# Patient Record
Sex: Female | Born: 1995 | Race: White | Hispanic: No | Marital: Single | Smoking: Never smoker
Health system: Southern US, Community
[De-identification: ages and names within clinical notes are randomized; demographics above are authoritative.]

## PROBLEM LIST (undated history)

## (undated) DIAGNOSIS — N390 Urinary tract infection, site not specified: Secondary | ICD-10-CM

## (undated) DIAGNOSIS — E669 Obesity, unspecified: Secondary | ICD-10-CM

## (undated) DIAGNOSIS — F32A Depression, unspecified: Secondary | ICD-10-CM

## (undated) DIAGNOSIS — H6690 Otitis media, unspecified, unspecified ear: Secondary | ICD-10-CM

## (undated) DIAGNOSIS — D649 Anemia, unspecified: Secondary | ICD-10-CM

## (undated) DIAGNOSIS — R519 Headache, unspecified: Secondary | ICD-10-CM

## (undated) DIAGNOSIS — H539 Unspecified visual disturbance: Secondary | ICD-10-CM

## (undated) DIAGNOSIS — F419 Anxiety disorder, unspecified: Secondary | ICD-10-CM

## (undated) DIAGNOSIS — K5900 Constipation, unspecified: Secondary | ICD-10-CM

## (undated) DIAGNOSIS — F909 Attention-deficit hyperactivity disorder, unspecified type: Secondary | ICD-10-CM

## (undated) HISTORY — DX: Otitis media, unspecified, unspecified ear: H66.90

## (undated) HISTORY — DX: Attention-deficit hyperactivity disorder, unspecified type: F90.9

## (undated) HISTORY — PX: WISDOM TOOTH EXTRACTION: SHX21

## (undated) HISTORY — DX: Unspecified visual disturbance: H53.9

## (undated) HISTORY — DX: Obesity, unspecified: E66.9

## (undated) HISTORY — DX: Urinary tract infection, site not specified: N39.0

## (undated) HISTORY — DX: Constipation, unspecified: K59.00

## (undated) HISTORY — PX: OTHER SURGICAL HISTORY: SHX169

---
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
## 2017-01-19 ENCOUNTER — Ambulatory Visit (INDEPENDENT_AMBULATORY_CARE_PROVIDER_SITE_OTHER): Payer: Medicaid Other | Admitting: Pediatrics

## 2017-01-19 ENCOUNTER — Encounter: Payer: Self-pay | Admitting: Pediatrics

## 2017-01-19 DIAGNOSIS — F902 Attention-deficit hyperactivity disorder, combined type: Secondary | ICD-10-CM | POA: Diagnosis not present

## 2017-01-19 DIAGNOSIS — H5210 Myopia, unspecified eye: Secondary | ICD-10-CM

## 2017-01-19 DIAGNOSIS — Z8739 Personal history of other diseases of the musculoskeletal system and connective tissue: Secondary | ICD-10-CM

## 2017-01-19 DIAGNOSIS — F4010 Social phobia, unspecified: Secondary | ICD-10-CM

## 2017-01-19 DIAGNOSIS — Z8742 Personal history of other diseases of the female genital tract: Secondary | ICD-10-CM

## 2017-01-19 MED ORDER — VYVANSE 70 MG PO CAPS: ORAL_CAPSULE | ORAL | 0 refills | Status: DC

## 2017-01-19 NOTE — Progress Notes (Signed)
[facility] [address], [place] [place] [address] Dept: [phone] Dept Fax: [phone] Loc: [phone] Loc Fax: [phone]  Medical Follow-up  Patient ID: Emily Pace, female  DOB: 07/21/1996, 21 y.o.  MRN: [id number]  Date of Evaluation: 01/19/17  PCP: Thurston Pounds, MD  Accompanied by: Mother Patient Lives with: mother and grandmother (maternal)  HISTORY/CURRENT STATUS:  HPI 3 month follow-up for medication management of ADHD, blood pressure check, and monitoring of weight. Patient is in her second semester at [employer]. This semester has been much more stressful than the first semester was because her classes are difficult and she started this semester with 4 classes. She dropped psychology but is still taking biology, critical thinking, and Spanish 1. She continues to take Vyvanse and think it helps her some although she is not certain how much it helps.  She does not appear to be having any significant side effects from the Vyvanse. She is still working at [employer] about 10 hours a week on weekends although the number of hours can be variable. She reports that she does not have any time to exercise although she plans on starting regular workouts at her grandmother's gym, [employer], in May 2018. She continues to have irregular menstrual periods with significant dysmenorrhea, but she has not been having a lot of jaw pain although her mouth pops and is sometimes hard to open. She has been experiencing anxiety intermittently but more lately because of the stress of the end of the school year and work.   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      Homework Time: 2-4 hours daily Performance/Grades: Doing well with  B's and 1C so far this semester although final exams and semester grades are coming up soon. Services: She requested accommodations through Disability Services at [facility] and is getting time and a half for tests and quizzes if needed. 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 She always works night shift from 5 PM till close. She reports that with school and work, she has little time to exercise although she and her mother recently went to the [place] and to [place] for spring break, and she did a lot of walking on the vacation.  Emily Pace does not have her driver's permit, but her mother reports that she is going to take [place] to [facility] and teacher to drive in the near future. Then Courteny plans on taking the driving test for her license once school is out next month. She has very little if any experience driving, however. They report that since she is over 34 years old, she doesn't need to have any experience in order to take her driver's test.  MEDICAL HISTORY:  Appetite: Good with mild appetite suppression on Vyvanse MVI/Other: None Fruits/Vegs: Att least 2-3 servings daily.  Calcium: Drinks milk a couple times a week, eats some cheese, but does not eat dairy products daily. She usually drinks low-fat milk. Iron: Likes meat and occasional eggs.  Sleep: Bedtime: 12-1 AM. Awakens: About 9 AM.  She usually gets enough hours of sleep per night although she doesn't always sleep well. Sleep Concerns: Initiation/Maintenance/Other: No problems with snoring, teeth grinding, or sleepwalking.  Individual Medical History/Review of System  Changes? She is monitored yearly by Dr. Maple Hudson for myopia in both eyes, and she is due for follow-up because she was last seen in May 2017 and was prescribed stronger lenses. She has very poor vision without glasses, 20/400 in both eyes according to her mother. Menstrual periods were reported to be irregular, and she experienced  significant dysmenorrhea with her period last month. She also has been diagnosed with TMJ by her otolaryngologist, Dr. Pollyann Kennedy, she has not been experiencing much jaw pain recently.  Allergies: Amoxicillin and Biaxin [clarithromycin]  Current Medications:  Current Outpatient Prescriptions:  .  cetirizine (ZYRTEC) 10 MG tablet, Take 10 mg by mouth as needed for allergies., Disp: , Rfl:  .  fluticasone (FLONASE) 50 MCG/ACT nasal spray, Place 1 spray into both nostrils as needed for allergies or rhinitis., Disp: , Rfl:  .  VYVANSE 70 MG capsule, Take 1 capsule by mouth daily WITH BREAKFAST, Disp: 30 capsule, Rfl: 0   Medication Side Effects: Appetite Suppression, which is very mild if at all.  Family Medical/Social History Changes?: No. Paternal grandmother has been diagnosed with polymyositis, and she sometimes has significant reactions to her medication. Emily Pace's mother smoking cigarettes without 3 months ago, and she reports that she has been feeling better, especially since she started taking Celexa several months ago. She thinks that she also needs to exercise more and may help Emily Pace become more motivated.   MENTAL HEALTH: Mental Health Issues: Anxiety is still an issue and has been more problematic with the end of the semester and work at present. She has not looked for a psychologist at [facility] as was recommended at her last visit, and she has also not looked for a dietitian or implemented any strategies for weight loss.  PHYSICAL EXAM: Vitals: Emily Pace's blood pressure was initially 140/80 using a large cuff on her right arm when she was in a sitting position, but it dropped to 116/78 after giving her several minutes to relax. Today's Vitals   01/19/17 1615  BP: 116/78  Weight: 297 lb (134.7 kg)  Height: 5' 6.75" (1.695 m)  , >99 %ile (Z= 2.43) based on CDC 2-20 Years BMI-for-age data using vitals from 01/19/2017. Body mass index is 46.87 kg/m.  General Exam: Physical Exam  Constitutional:  She appears well-developed and well-nourished.  She is morbidly obese in appearance.  HENT:  Head: Normocephalic and atraumatic.  Right Ear: External ear normal.  Left Ear: External ear normal.  Nose: Nose normal.  Mouth/Throat: Oropharynx is clear and moist.  Eyes: Conjunctivae and EOM are normal. Pupils are equal, round, and reactive to light.  Neck: Normal range of motion. Neck supple. No thyromegaly present.  Cardiovascular: Normal rate, regular rhythm and normal heart sounds.   No murmur heard. Pulmonary/Chest: Effort normal and breath sounds normal. No respiratory distress.  Abdominal: Soft. She exhibits no distension. There is no tenderness. There is no guarding.  It is almost impossible to examine her abdomen because of all the adiposity that is present. She does not appear to be tender, but the possibility of masses or hepatosplenomegaly cannot be determined because of all the adiposity.  Genitourinary:  Genitourinary Comments: Deferred  Musculoskeletal: Normal range of motion.  Lymphadenopathy:    She has no cervical adenopathy.  Skin: Skin is warm and dry.  Psychiatric:  She is pleasant, does not appear to be overly anxious, and communicates appropriately with this examiner. It does not appear that she grasps the magnitude of her weight problem and its possible  effects on her health. She also has always come with her mother to appointments at this Center, and they seem to be enmeshed with each other.   Neurological: Oriented to person, place, time and situation. Cranial Nerves: ll-XII intact including normal vision with glasses (by report), ability to move eyes in all directions and close eyes, a symmetrical smile, normal hearing (by report), and ability to swallow, elevate shoulders, and protrude and lateralize tongue. Neuromuscular:  Motor Mass: normal Tone: normal Strength: normal DTR's: Difficult to elicit but symmetrical.  Cerebellar: Normal gait. No ataxia, nystagmus, or  tremor noted. Finger-to-finger and finger-to-nose maneuvers done appropriately without overflow movements(synkinesis), rapid alternating movements done well, oriented to right and left on self and on a mirror image. Sensory: Fine touch grossly intact without tactile defensiveness. Gross motor skills: Able to walk on heels and toes, perform a tandem gait both forward and reversed,hop on each foot alone but only twice on each, and stand on each foot alone for at least 5 seconds.  Testing/Developmental Screens: CGI: 3  ASRS: Part A-8,  Part B-4   DIAGNOSES:    ICD-9-CM ICD-10-CM   1. Obesity, morbid, BMI 40.0-49.9 (HCC) 278.01 E66.01   2. ADHD (attention deficit hyperactivity disorder), combined type 314.01 F90.2 VYVANSE 70 MG capsule  3. Social anxiety disorder 300.23 F40.10   4. History of irregular menstrual cycles V13.29 Z87.42   5. Myopia, unspecified laterality 367.1 H52.10   6. History of TMJ disorder V13.59 Z87.39    RECOMMENDATIONS:  I recommend that Emily Pace see a dietitian and start a regular exercise program preferably under the guidance of a personal trainer. I have been recommending this for a long time, however, so I am not optimistic that this will happen despite Emily Pace's they did interest in doing these things.  I again recommended that Emily Pace see a counselor and suggested that she inquire if one might be available through [facility] since she is in college and there. I also told her that she may benefit from medication management of anxiety but I would not treat her with medication unless she was also under the care of a counselor.  Emily Pace's blood pressure has been elevated on occasion, but I suspect that this is probably "white coat hypertension, because I rechecked her blood pressure today after it was initially 140/80 and it had come down to 116/78. This needs to be monitored closely and treated if it continues to be elevated consistently and does not come down on recheck.  Emily Pace has  been very vague about whether Vyvanse has been helping her much or at all during her first year of college. Therefore, I told her to continue taking 70 mg of Vyvanse every morning until school is out in several weeks, and then stop taking Vyvanse over the summer. I also asked her to try to be aware of possible differences when she is not taking the medication. I don't really think that the Vyvanse is contributing to her elevated blood pressure although it would be best not treating her if it is not helping her. I also don't think it gives her very much appetite suppression, so this possibly beneficial side effect of Vyvanse is not helping her either.  Emily Pace reports that she has very poor visual acuity without her glasses, and she is due to see her eye doctor next month. I emphasized that she make an appointment for this to happen.   Patient Instructions  I recommend before the school year is over, try to find  out if there is a Warden/ranger at [facility] could provide regular counseling. It also would be helpful to see a dietitian to help with weight management, and I think that it is very important for you establish a regular exercise program. Ideally, you could find somebody who would help you with both dietary weight management and exercise. If you are seeing a counselor and continue to have problems with anxiety, medication management may be helpful.  I recommend that she see a gynecologist because of the difficulty you're having with your menstrual periods and the cramps you are experiencing. Sometimes it is helpful to be treated with a low dose birth control pill although this would be the decision of the doctor.  Continue Vyvanse 70 mg every morning with or after breakfast until the school year is over. The school is finished, try stopping this medication and pay attention to how you are feeling off of the medication. Is your focus as good off medication as it is on medication, does your anxiety improve  when you are off Vyvanse, and is anything else different when you're off Vyvanse. If it truly helps you concentrate, it would be reasonable to re-start Vyvanse with school in the fall.  Take your driver's test and hopefully get your license as soon as she can.  Try to eat 3 meals a day and 1 or 2 snacks. Make sure that your portion sizes are reasonable and try not to eat seconds as a general rule.   You are due for follow-up in 3 months, and I recommend that you see Dawn, one of the nurse practitioners at this Center.         NEXT APPOINTMENT: Return in about 3 months (around 04/20/2017).   Greater than 50 percent of the time spent in counseling, discussing diagnosis and management of symptoms with patient and family.   Roda Shutters, MD Counseling Time: 40 minutes         Total Contact Time: 55 minutes

## 2017-01-19 NOTE — Patient Instructions (Addendum)
I recommend before the school year is over, try to find out if there is a psychologist at [facility] could provide regular counseling. It also would be helpful to see a dietitian to help with weight management, and I think that it is very important for you establish a regular exercise program. Ideally, you could find somebody who would help you with both dietary weight management and exercise. If you are seeing a counselor and continue to have problems with anxiety, medication management may be helpful.  I recommend that she see a gynecologist because of the difficulty you're having with your menstrual periods and the cramps you are experiencing. Sometimes it is helpful to be treated with a low dose birth control pill although this would be the decision of the doctor.  Continue Vyvanse 70 mg every morning with or after breakfast until the school year is over. The school is finished, try stopping this medication and pay attention to how you are feeling off of the medication. Is your focus as good off medication as it is on medication, does your anxiety improve when you are off Vyvanse, and is anything else different when you're off Vyvanse. If it truly helps you concentrate, it would be reasonable to re-start Vyvanse with school in the fall.  Take your driver's test and hopefully get your license as soon as she can.  Try to eat 3 meals a day and 1 or 2 snacks. Make sure that your portion sizes are reasonable and try not to eat seconds as a general rule.   You are due for follow-up in 3 months, and I recommend that you see Dawn, one of the nurse practitioners at this Center.

## 2017-04-14 ENCOUNTER — Encounter (INDEPENDENT_AMBULATORY_CARE_PROVIDER_SITE_OTHER): Payer: Self-pay | Admitting: Pediatric Endocrinology

## 2017-04-14 ENCOUNTER — Ambulatory Visit (INDEPENDENT_AMBULATORY_CARE_PROVIDER_SITE_OTHER): Payer: Medicaid Other | Admitting: Pediatric Endocrinology

## 2017-04-14 LAB — POCT GLUCOSE (DEVICE FOR HOME USE): Glucose Fasting, POC: 79 mg/dL (ref 70–99)

## 2017-04-14 LAB — POCT GLYCOSYLATED HEMOGLOBIN (HGB A1C): Hemoglobin A1C: 4.9

## 2017-04-14 NOTE — Progress Notes (Signed)
Pediatric Endocrinology Consultation Initial Visit  Chief Complaint: Morbid Obesity and elevated blood pressure  HPI: Emily Pace  is a 21 y.o. female being seen in consultation at the request of  Little, Norva Pavlov, MD for evaluation of morbid obesity and elevated blood pressure.    1. Emily Pace was initially referred to [facility] for endocrine evaluation in 08/2012, at which time hbA1c was drawn and was 5.2%, glucose was 82, weight was 114.85kg.  At that time the family felt they had waited too long to be seen and left before seeing the provider.  Desire was seen by her PCP on 01/29/15 at which time weight was 284lb, BMI 45.15, BP 140/80.  Glucose was 94, lipid panel was normal.   Wednesday was last seen in pediatric endocrine clinic in 2016. She was re-referred in 2018 for evaluation of ongoing weight gain and increased hunger signalling.   2. Emily Pace was last seen in pediatric endocrinology clinic on  04/03/15. In the interim she has been generally healthy. She has been working on ADHD concerns. She was recently referred to a new psychologist to discuss anxiety issues. She has not yet established with this provider.   She has graduated high school and will be starting at [employer] in the fall. She is hoping to go into some medical field. She is currently working nights at [employer]. She feels that this is a challenge because she has easy access to food and drinks that she understands are not the healthiest choices. She has been drinking regular soda or sweet tea when she is at work. She would rather drink water but they do not have a water line on their drink machine. She does not like diet drinks. She feels that she makes healthy food choices- but that she is always hungry and healthy food does not make her feel full. She has a hard time exercising. She thinks that it takes too much time and she does not like to do it in front of other people. She is able to do 40 stair steps in clinic today- 20 on each side. She says  that she could probably do jumping jacks but is not excited to try.   Emily Pace feels that her weight became an issue around 6th grade. She thinks that she has always been big. She does not think that she has always been hungry. She thinks that most of her mom's family have issues with weight. She lives at home and is unsure how she can make changes on her own.   She had menarche at age 65. She thinks that her periods were regular at the beginning but by high school were irregular. She will sometimes have a cycle every month and sometimes skip a month. She does not recall going longer than 2 months without a period.   She thinks that she has had cholesterol and liver enzymes tested in the past and that these have been ok. She gets queasy with blood draws and would prefer not to have blood drawn today. She was already light headed with having her finger stick glucose done.     Diet review: - this is the review from 2 years ago. Emily Pace says that this is still all true Breakfast- None eaten before school because it makes her stomach upset.  At home she eats bacon and eggs or fruit loops with 2% milk.   Midmorning snack- none  Lunch- sandwich with fruit, chips or crackers, flavored water Afternoon snack- something light Dinner- makes most  meals at home.  [name] likes to cook and cooks "healthy" with veggies.  Likes to make [place] burgers Bedtime snack- occasional ice cream Drinks water or sweet tea  On an average day not at work she has 1-2 sweet drinks per day. At work she has 2-4 sweet drinks.   She feels that her biggest challenge will be giving up the sweet tea. Discussed weaning by dilluting with water or unsweet tea.     2. ROS: Greater than 10 systems reviewed with pertinent positives listed in HPI, otherwise neg. Constitutional: recent weight loss (2 pounds), good energy level, occasional headache Eyes: No changes in vision- wears glasses Ears/Nose/Mouth/Throat: No difficulty  swallowing. Respiratory: No increased work of breathing Gastrointestinal: No constipation or diarrhea. No abdominal pain- Stays hungry Genitourinary: No nocturia, no polyuria Musculoskeletal: No joint pain. Some TMJ Neurologic: Normal sensation, no tingling Endocrine: No polydipsia Psychiatric: Normal affect  Review of Systems  All other systems reviewed and are negative.   Past Medical History:   Past Medical History:  Diagnosis Date  . ADHD (attention deficit hyperactivity disorder)    Followed by Dr. Kem Kays.  Started vyvanse at age 76yrs  . Constipation   . Obesity   . Otitis media   . Urinary tract infection   . Vision abnormalities     Current Outpatient Prescriptions on File Prior to Visit  Medication Sig Dispense Refill  . VYVANSE 70 MG capsule Take 1 capsule by mouth daily WITH BREAKFAST 30 capsule 0  . cetirizine (ZYRTEC) 10 MG tablet Take 10 mg by mouth as needed for allergies.    . fluticasone (FLONASE) 50 MCG/ACT nasal spray Place 1 spray into both nostrils as needed for allergies or rhinitis.     No current facility-administered medications on file prior to visit.     Allergies  Allergen Reactions  . Amoxicillin Hives  . Biaxin [Clarithromycin] Rash    Past Surgical History:  Procedure Laterality Date  . none      Family History:  Family History  Problem Relation Age of Onset  . Obesity Mother   . Hypertension Mother        Prescribed medication though does not take it  . Obesity Father   . Schizophrenia Father   . Hypertension Maternal Grandmother   Maternal grandmother with kidney disease Maternal height 24ft 6in Paternal height 26ft  Social History: Lives with: mother and maternal grandmother  Starting [employer] - thinking about psych or medicine.    Physical Exam:  Vitals:   04/14/17 1523  BP: 110/82  Weight: 296 lb (134.3 kg)  Height: 5' 6.81" (1.697 m)  Blood pressure percentiles are 35.8 % systolic and 95.5 % diastolic based on the  August 2017 AAP Clinical Practice Guideline. This reading is in the Stage 1 hypertension range (BP >= 130/80).  BP 110/82   Ht 5' 6.81" (1.697 m)   Wt 296 lb (134.3 kg)   BMI 46.62 kg/m  Body mass index: body mass index is 46.62 kg/m. Blood pressure percentiles are 36 % systolic and 95 % diastolic based on the August 2017 AAP Clinical Practice Guideline. Blood pressure percentile targets: 90: 127/78, 95: 129/82, 95 + 12 mmHg: 141/94. This reading is in the Stage 1 hypertension range (BP >= 130/80).  General: Well developed, obese female in no acute distress.  Weight stable to slightly decreased from PCP visit in May Head: Normocephalic, atraumatic.   Eyes:  Pupils equal and round. EOMI.   Sclera white.  No  eye drainage.   Ears/Nose/Mouth/Throat: Nares patent, no nasal drainage.  Normal dentition, mucous membranes moist.  Oropharynx intact. Neck: supple, no cervical lymphadenopathy, no thyromegaly.  Very minimal acanthosis nigricans on posterior neck Cardiovascular: regular rate, normal S1/S2, no murmurs Respiratory: No increased work of breathing.  Lungs clear to auscultation bilaterally.  No wheezes. Abdomen: soft, nontender, nondistended. Normal bowel sounds.  No appreciable masses.  Few light pink striae on lateral abdomen bilaterally Extremities: warm, well perfused, cap refill < 2 sec.   Musculoskeletal: Normal muscle mass.  Normal strength Skin: warm, dry.  No rash.  Minimal acanthosis on posterior neck.  Few light pink striae on shoulders and abdomen. Axillary acanthosis Neurologic: alert and oriented, normal speech   Laboratory Evaluation: Results for orders placed or performed in visit on 04/14/17  POCT HgB A1C  Result Value Ref Range   Hemoglobin A1C 4.9   POCT Glucose (Device for Home Use)  Result Value Ref Range   Glucose Fasting, POC 79 70 - 99 mg/dL   POC Glucose  70 - 99 mg/dl     Assessment/Plan: Lindie SpruceMeghan is a 21 y.o. female with morbid obesity and mild acanthosis  nigricans with a normal hemoglobin A1c.  She has had elevated blood pressures in the past though blood pressure is normal today. She would greatly benefit from lifestyle modifications.  1. Morbid Obesity and Acanthosis nigricans - POCT Glucose (CBG) and POCT HgB A1C obtained today; these were normal.  Reviewed results with family -Reviewed growth chart with the family -Recommended dietary changes with focus on eliminating sweet drinks. She feels that her biggest challenge will be sweet tea.  -Recommended increased activity- set goals for daily stair steps or jumping jacks. She feels that she can do this. Set goal for 70 stair steps at next visit (did 40 today).    2. Elevated blood pressure -Normal today.  Will monitor at future visits.  Weight loss will most definitely improve this as well.   Follow-up:   Return in about 6 weeks (around 05/26/2017).    Dessa PhiJennifer Nicolet Griffy, MD  Level of Service: This visit lasted in excess of 40 minutes. More than 50% of the visit was devoted to counseling.

## 2017-04-14 NOTE — Patient Instructions (Signed)
You have insulin resistance.  This is making you more hungry, and making it easier for you to gain weight and harder for you to lose weight.  Our goal is to lower your insulin resistance and lower your diabetes risk.   Less Sugar In: Avoid sugary drinks like soda, juice, sweet tea, fruit punch, and sports drinks. Drink water, sparkling water ([place] or  [employer] water), or unsweet tea. 1 serving of plain milk (not chocolate or strawberry) per day.  Wean sweet tea by slowly increasing the amount of un sweet tea in the cup.   More Sugar Out:  Exercise every day! Try to do a short burst of exercise like 4 stair steps- before each meal to help your blood sugar not rise as high or as fast when you eat.  Add at least 5 stair steps each week. Goal of 70 stair steps by next visit.   You may lose weight- you may not. Either way- focus on how you feel, how your clothes fit, how you are sleeping, your mood, your focus, your energy level and stamina. This should all be improving.

## 2017-05-27 ENCOUNTER — Ambulatory Visit (INDEPENDENT_AMBULATORY_CARE_PROVIDER_SITE_OTHER): Payer: Medicaid Other | Admitting: Pediatrics

## 2017-05-27 ENCOUNTER — Encounter (INDEPENDENT_AMBULATORY_CARE_PROVIDER_SITE_OTHER): Payer: Self-pay | Admitting: Pediatrics

## 2017-05-27 DIAGNOSIS — L83 Acanthosis nigricans: Secondary | ICD-10-CM | POA: Diagnosis not present

## 2017-05-27 NOTE — Progress Notes (Signed)
Pediatric Endocrinology Consultation follow up Visit  Chief Complaint: Morbid Obesity and elevated blood pressure  HPI: Emily Pace  is a 21 y.o. female presenting for follow-up of morbid obesity and elevated blood pressure. She attended this visit alone.   1. Emily Pace was initially referred to [facility] for endocrine evaluation in 08/2012, at which time hbA1c was drawn and was 5.2%, glucose was 82, weight was 114.85kg.  At that time the family felt they had waited too long to be seen and left before seeing the provider.  Emily Pace was seen by her PCP on 01/29/15 at which time weight was 284lb, BMI 45.15, BP 140/80.  Glucose was 94, lipid panel was normal.  She was again evaluated by pediatric endocrinology in June 2016.  She was re-referred in 2018 for evaluation of ongoing weight gain and increased hunger signalling.   2. Since her last visit on 04/14/2017, Emily Pace has been well. She has tried to make some diet changes. She has not been very active recently. She did just return from vacation at the beach.  She lost 2 pounds since last visit.  Diet review: Emily MilletMegan reports that she eats healthy. She works at [employer] and will sometimes eat there if she forgets to pack a lunch. Her goal for the next several months is to eat there only twice weekly.  She has cut down on the amount of regular soda that she drinks. She has started drinking half sweet half unsweetened tea while at work. She does not drink diet soda as she is concerned the artificial sweetener may be harmful.  Activity: She has not been active lately.  She plans to be active walking around campus when school starts. She reports she does not have time to go to a gym.   3. ROS: Greater than 10 systems reviewed with pertinent positives listed in HPI, otherwise neg. Constitutional: She has lost 2 pounds in the last 6 weeks. Eyes: wears glasses, she reports she needs to have her eyes rechecked Respiratory: Using allergy medicine when necessary   Genitourinary: No nocturia, no polyuria Musculoskeletal: No joint deformity Endocrine: No polydipsia.  Last A1c normal in 03/2017 Psychiatric: Normal affect, treated with ADHD meds  Review of Systems  All other systems reviewed and are negative.   Past Medical History:   Past Medical History:  Diagnosis Date  . ADHD (attention deficit hyperactivity disorder)    Followed by Dr. Kem KaysKuhn.  Started vyvanse at age 89yrs  . Constipation   . Obesity   . Otitis media   . Urinary tract infection   . Vision abnormalities     Current Outpatient Prescriptions on File Prior to Visit  Medication Sig Dispense Refill  . VYVANSE 70 MG capsule Take 1 capsule by mouth daily WITH BREAKFAST 30 capsule 0  . cetirizine (ZYRTEC) 10 MG tablet Take 10 mg by mouth as needed for allergies.    . fluticasone (FLONASE) 50 MCG/ACT nasal spray Place 1 spray into both nostrils as needed for allergies or rhinitis.     No current facility-administered medications on file prior to visit.     Allergies  Allergen Reactions  . Amoxicillin Hives  . Biaxin [Clarithromycin] Rash    Past Surgical History:  Procedure Laterality Date  . none      Family History:  Family History  Problem Relation Age of Onset  . Obesity Mother   . Hypertension Mother        Prescribed medication though does not take it  .  Obesity Father   . Schizophrenia Father   . Hypertension Maternal Grandmother   Maternal grandmother with kidney disease Maternal height 69ft 6in Paternal height 6ft  Social History: Lives with: mother and maternal grandmother Attends [employer]   Physical Exam:  Vitals:   05/27/17 1354  BP: 118/72  Pulse: 80  Weight: 294 lb 6.4 oz (133.5 kg)  No height on file for this encounter.  BP 118/72   Pulse 80   Wt 294 lb 6.4 oz (133.5 kg)   BMI 46.37 kg/m  Body mass index: body mass index is 46.37 kg/m. No height on file for this encounter.   Wt Readings from Last 3 Encounters:  05/27/17 294 lb 6.4 oz  (133.5 kg) (>99 %, Z= 2.75)*  04/14/17 296 lb (134.3 kg) (>99 %, Z= 2.74)*  04/03/15 262 lb (118.8 kg) (>99 %, Z= 2.56)*   * Growth percentiles are based on CDC 2-20 Years data.   Ht Readings from Last 3 Encounters:  04/14/17 5' 6.81" (1.697 m) (84 %, Z= 1.00)*  04/03/15 5' 6.93" (1.7 m) (87 %, Z= 1.10)*  09/07/12 5' 6.42" (1.687 m) (88 %, Z= 1.20)*   * Growth percentiles are based on CDC 2-20 Years data.   Body mass index is 46.37 kg/m.  >99 %ile (Z= 2.75) based on CDC 2-20 Years weight-for-age data using vitals from 05/27/2017. No height on file for this encounter.  General: Well developed, obese female in no acute distress.  Appears stated age Head: Normocephalic, atraumatic.   Eyes:  Pupils equal and round. EOMI.   Sclera white.  No eye drainage.  Wearing glasses Ears/Nose/Mouth/Throat: Nares patent, no nasal drainage.  Normal dentition, mucous membranes moist.  Oropharynx intact. Neck: supple, no cervical lymphadenopathy, no thyromegaly, minimal acanthosis nigricans on neck Cardiovascular: regular rate, normal S1/S2, no murmurs Respiratory: No increased work of breathing.  Lungs clear to auscultation bilaterally.  No wheezes. Abdomen: soft, nontender, nondistended. Few light striae on lateral abdomen Extremities: warm, well perfused, cap refill < 2 sec.   Musculoskeletal: Normal muscle mass.  Normal strength Skin: warm, dry.  No rash or lesions. Mild axillary acanthosis Neurologic: alert and oriented, normal speech  Laboratory Evaluation: Results for orders placed or performed in visit on 04/14/17  POCT HgB A1C  Result Value Ref Range   Hemoglobin A1C 4.9   POCT Glucose (Device for Home Use)  Result Value Ref Range   Glucose Fasting, POC 79 70 - 99 mg/dL   POC Glucose  70 - 99 mg/dl   Assessment/Plan: Emily Pace is a 21 y.o. female with morbid obesity (BMI is at the 99th percentile at 46.62 kg/m2) and acanthosis nigricans.  She has had slight weight loss since last visit and  has modified her diet. She would continue to benefit from lifestyle modifications.  With this BMI she remains at high risk for chronic health problems including type 2 diabetes hypertension and hyperlipidemia. She seems somewhat motivated to make lifestyle modifications.  1. Morbid Obesity and 2. Acanthosis nigricans -Encouraged increased physical activity including walking daily -Commended on diet changes thus far. Encouraged not to drink her calories. Encouraged her to eat most meals at home. -Reviewed growth chart with her.  Discussed that she remains at high risk for chronic diseases with this elevated BMI -Will continue to follow A1c, blood pressure at future visits  Follow-up:   Return in about 3 months (around 08/27/2017).    Casimiro Needle, MD

## 2017-05-27 NOTE — Patient Instructions (Addendum)
It was a pleasure to see you in clinic today.   Feel free to contact our office at [phone] with questions or concerns.  -don't drink calories! -Walk everyday! -Limit eating out!

## 2020-08-04 ENCOUNTER — Other Ambulatory Visit: Payer: Self-pay

## 2020-08-04 ENCOUNTER — Ambulatory Visit (HOSPITAL_COMMUNITY)
Admission: EM | Admit: 2020-08-04 | Discharge: 2020-08-04 | Disposition: A | Discharge status: Home / Self Care | Payer: Self-pay | Attending: Emergency Medicine | Admitting: Emergency Medicine

## 2020-08-04 DIAGNOSIS — R1033 Periumbilical pain: Secondary | ICD-10-CM

## 2020-08-04 DIAGNOSIS — R1011 Right upper quadrant pain: Secondary | ICD-10-CM

## 2020-08-04 DIAGNOSIS — Z3202 Encounter for pregnancy test, result negative: Secondary | ICD-10-CM

## 2020-08-04 LAB — POCT URINALYSIS DIPSTICK, ED / UC
Bilirubin Urine: NEGATIVE
Glucose, UA: NEGATIVE mg/dL
Hgb urine dipstick: NEGATIVE
Ketones, ur: NEGATIVE mg/dL
Nitrite: NEGATIVE
Protein, ur: NEGATIVE mg/dL
Specific Gravity, Urine: 1.02 (ref 1.005–1.030)
Urobilinogen, UA: 0.2 mg/dL (ref 0.0–1.0)
pH: 7.5 (ref 5.0–8.0)

## 2020-08-04 LAB — POC URINE PREG, ED: Preg Test, Ur: NEGATIVE

## 2020-08-04 NOTE — ED Provider Notes (Signed)
[facility]    CSN: [id number] Arrival date & time: 08/04/20  1031      History   Chief Complaint Chief Complaint  Patient presents with  . Abdominal Pain    HPI Emily Pace is a 24 y.o. female.   Emily Pace presents with complaints of abdominal pain. intermittent "pings of pain" since July. Right side. She feels a constant discomfort, but intermittent pings of pain. Nausea intermittently, no vomiting. She feels like it is worsening. Had to leave work yesterday due to the nausea. Normal BM's no constipation. She has taken ibuprofen intermittently which hasn't necessarily helped. Eating a large meal worsens the pain. No previous abdominal surgeries. One day did have RUQ pain as well, but this occurred only once. She feels that there is a palpable bulge to the right of the umbilicus. movement sometimes worsens the pain. She does experience back pain as well. LMP 9/22. Regular periods, on birth control. When menstruating she does experience RLQ pain as well.    ROS per HPI, negative if not otherwise mentioned.      Past Medical History:  Diagnosis Date  . ADHD (attention deficit hyperactivity disorder)    Followed by Dr. Kem Kays.  Started vyvanse at age 23yrs  . Constipation   . Obesity   . Otitis media   . Urinary tract infection   . Vision abnormalities     Patient Active Problem List   Diagnosis Date Noted  . 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 Social anxiety disorder 01/27/2016  . Obesity 04/03/2015  . Acanthosis nigricans 04/03/2015  . Morbid obesity ([facility]CC) 09/07/2012    Past Surgical History:  Procedure Laterality Date  . none      OB History   No obstetric history on file.      Home Medications    Prior to Admission medications   Medication Sig Start Date End Date Taking? Authorizing Provider  cetirizine (ZYRTEC) 10 MG tablet Take 10 mg by mouth as needed for allergies.    [provider]  fluticasone (FLONASE) 50 MCG/ACT nasal spray Place 1 spray into both nostrils as needed for allergies or rhinitis.    [provider]  VYVANSE 70 MG capsule Take 1 capsule by mouth daily WITH BREAKFAST 01/19/17   Roda Shutters, MD    Family History Family History  Problem Relation Age of Onset  . Obesity Mother   . Hypertension Mother        Prescribed medication though does not take it  . Obesity Father   . Schizophrenia Father   . Hypertension Maternal Grandmother     Social History Social History   Tobacco Use  . Smoking status: Passive Smoke Exposure - Never Smoker  . Smokeless tobacco: Never Used  Substance Use Topics  . Alcohol use: No    Alcohol/week: 0.0 standard drinks    Comment: Patient has been asking her mother about mixed drinks but does not drink on a regular basis.  . Drug use: No     Allergies   Amoxicillin and Biaxin [clarithromycin]   Review of Systems Review of Systems   Physical Exam Triage Vital Signs ED Triage Vitals  Enc Vitals Group     BP 08/04/20 1159 (!) 150/86     Pulse Rate 08/04/20 1159 91     Resp 08/04/20 1159 18     Temp 08/04/20 1159 98.6 F (37 C)     Temp Source 08/04/20  1159 Oral     SpO2 08/04/20 1159 100 %     Weight 08/04/20 1202 (!) 310 lb (140.6 kg)     Height 08/04/20 1202 5\' 7"  (1.702 m)     Head Circumference --      Peak Flow --      Pain Score 08/04/20 1201 2     Pain Loc --      Pain Edu? --      Excl. in GC? --    No data found.  Updated Vital Signs BP (!) 150/86 (BP Location: Right Arm)   Pulse 91   Temp 98.6 F (37 C) (Oral)   Resp 18   Ht 5\' 7"  (1.702 m)   Wt (!) 310 lb (140.6 kg)   LMP 07/10/2020   SpO2 100%   BMI 48.55 kg/m   Visual Acuity Right Eye Distance:   Left Eye Distance:   Bilateral Distance:    Right Eye Near:   Left Eye Near:    Bilateral Near:     Physical Exam Constitutional:      General: She is not in acute distress.    Appearance: She is  well-developed.  Cardiovascular:     Rate and Rhythm: Normal rate.  Pulmonary:     Effort: Pulmonary effort is normal.  Abdominal:     Tenderness: There is abdominal tenderness in the right upper quadrant and periumbilical area.  Skin:    General: Skin is warm and dry.  Neurological:     Mental Status: She is alert and oriented to person, place, and time.      UC Treatments / Results  Labs (all labs ordered are listed, but only abnormal results are displayed) Labs Reviewed  POCT URINALYSIS DIPSTICK, ED / UC  POC URINE PREG, ED    EKG   Radiology No results found.  Procedures Procedures (including critical care time)  Medications Ordered in UC Medications - No data to display  Initial Impression / Assessment and Plan / UC Course  I have reviewed the triage vital signs and the nursing notes.  Pertinent labs & imaging results that were available during my care of the patient were reviewed by me and considered in my medical decision making (see chart for details).     Unfortunately Emily Pace is currently uninsured and obviously concerned about cost of evaluation today, therefore declines lab testing to evaluate LFT's, cbc and lipase. No appreciable hernia on exam, although this is considered, as well as cholelithiasis. Eating plan discussed at this time and encouraged follow up with pcp for further evaluation- states she will have insurance in two weeks. Return precautions provided. Patient verbalized understanding and agreeable to plan.   Final Clinical Impressions(s) / UC Diagnoses   Final diagnoses:  Periumbilical abdominal pain  RUQ abdominal pain     Discharge Instructions     I do feel you likely will need further evaluation of your pain, likely with ultrasound of the abdomen.  I am concerned about gallstones, I would recommend following the provided eating plan to see if this helps with your pain.  Tylenol 1000 mg every 8 hours to help with pain.  Please  follow up with your primary care provider for further evaluation.  Return or go to the ER if significant worsening of symptoms.     ED Prescriptions    None     PDMP not reviewed this encounter.   , NP 08/04/20 1312

## 2020-08-04 NOTE — Discharge Instructions (Signed)
I do feel you likely will need further evaluation of your pain, likely with ultrasound of the abdomen.  I am concerned about gallstones, I would recommend following the provided eating plan to see if this helps with your pain.  Tylenol 1000 mg every 8 hours to help with pain.  Please follow up with your primary care provider for further evaluation.  Return or go to the ER if significant worsening of symptoms.

## 2020-08-04 NOTE — ED Triage Notes (Signed)
Pt reports going to her PCP in July. For RLQ pain Pt does not have health insurance and Has not had a CT for ABD pain. Pt reports ABD pain has been more frequently and Pain occurs mor after eating a bigger meal. Pt also has had nausea and bloating.

## 2020-08-04 NOTE — ED Notes (Signed)
PT in [place] to collect Urine sample

## 2020-09-05 ENCOUNTER — Other Ambulatory Visit: Payer: Self-pay | Admitting: Surgery

## 2020-09-05 DIAGNOSIS — R1011 Right upper quadrant pain: Secondary | ICD-10-CM

## 2020-10-03 ENCOUNTER — Ambulatory Visit
Admission: RE | Admit: 2020-10-03 | Discharge: 2020-10-03 | Disposition: A | Discharge status: Home / Self Care | Payer: 59 | Source: Ambulatory Visit | Attending: Surgery | Admitting: Surgery

## 2020-10-03 DIAGNOSIS — R1011 Right upper quadrant pain: Secondary | ICD-10-CM

## 2020-10-08 ENCOUNTER — Other Ambulatory Visit: Payer: Self-pay | Admitting: Surgery

## 2020-10-08 ENCOUNTER — Other Ambulatory Visit (HOSPITAL_COMMUNITY): Payer: Self-pay | Admitting: Surgery

## 2020-10-08 DIAGNOSIS — R1011 Right upper quadrant pain: Secondary | ICD-10-CM

## 2020-10-16 DIAGNOSIS — D500 Iron deficiency anemia secondary to blood loss (chronic): Secondary | ICD-10-CM | POA: Insufficient documentation

## 2020-10-29 ENCOUNTER — Other Ambulatory Visit: Payer: Self-pay

## 2020-10-29 ENCOUNTER — Ambulatory Visit (HOSPITAL_COMMUNITY)
Admission: RE | Admit: 2020-10-29 | Discharge: 2020-10-29 | Disposition: A | Discharge status: Home / Self Care | Payer: 59 | Source: Ambulatory Visit | Attending: Surgery | Admitting: Surgery

## 2020-10-29 DIAGNOSIS — R1011 Right upper quadrant pain: Secondary | ICD-10-CM | POA: Insufficient documentation

## 2020-10-29 MED ORDER — TECHNETIUM TC 99M MEBROFENIN IV KIT
5.0000 | PACK | Freq: Once | INTRAVENOUS | Status: AC | PRN
  Administered 2020-10-29: 5 via INTRAVENOUS

## 2020-11-07 ENCOUNTER — Ambulatory Visit: Payer: Self-pay | Admitting: Surgery

## 2020-11-07 NOTE — H&P (Signed)
Emily Pace Appointment: 09/03/2020 11:00 AM Location: [facility] Patient #: [id number] DOB: 08-04-1996 Single / Language: [place] / Race: White Female   History of Present Illness Emily Sportsman MD; 09/03/2020 1:00 PM) The patient is a 25 year old female who presents with abdominal pain. Note for "Abdominal pain": ` ` ` Patient sent for surgical consultation at the request of Emily Haber, NP  Chief Complaint: Abdominal pain. Question of gallbladder etiology ` ` The patient is a young woman that the struggle with intermittent abdominal pain for the past few months. Usually triggered by fast foods and heavy or greasy foods. She adjusted her diet. However she had an episode of sharp abdominal pain in the right upper quadrant. Was worried she may have a gallbladder attack. She went to urgent care center. However she was hesitant to get any labs or studies such as an ultrasound given the fact she was not ensured at the time. His recommended consider following up with her primary care office and consider workup. Sent to me for surgical consultation without any studies. Patient comes today by herself. She denies any severe pain but does have some chronic achiness in her right upper side triggered by eating. Denies really much in way of heartburn or reflux. She can sleep lying on her side. No pillows. No increased belching. She usually moves her bowels every day. She can walk a half hour without difficulty. No diabetes. No smoking. No sleep apnea. No cardiac or pulmonary issues. Does struggle with some social anxiety that has been stable for a while. She notes she recently got ensured and is trying to readdress her concerns.  No personal nor family history of GI/colon cancer, inflammatory bowel disease, irritable bowel syndrome, allergy such as Celiac Sprue, dietary/dairy problems, colitis, ulcers nor gastritis. No recent sick contacts/gastroenteritis. No  travel outside the country. No changes in diet. No dysphagia to solids or liquids. No significant heartburn or reflux. No melena, hematemesis, coffee ground emesis. No evidence of prior gastric/peptic ulceration.  (Review of systems as stated in this history (HPI) or in the review of systems. Otherwise all other 12 point ROS are negative) ` ` ###########################################`  This patient encounter took 30 minutes today to perform the following: obtain history, perform exam, review outside records, interpret tests & imaging, counsel the patient on their diagnosis; and, document this encounter, including findings & plan in the electronic health record (EHR).   Past Surgical History Ethlyn Gallery, CMA; 09/03/2020 10:59 AM) Oral Surgery   Diagnostic Studies History Emily Pace, CMA; 09/03/2020 10:59 AM) Colonoscopy  never Mammogram  never Pap Smear  1-5 years ago  Allergies Emily Pace, CMA; 09/03/2020 11:00 AM) Penicillins   Medication History (Alisha Pace, CMA; 09/03/2020 11:01 AM) Sertraline HCl (100MG  Tablet, Oral) Active. Aviane (0.1-20MG -MCG Tablet, Oral) Active. Medications Reconciled  Pregnancy / Birth History , CMA; 09/03/2020 10:59 AM) Age at menarche  13 years. Contraceptive History  Oral contraceptives. Gravida  0 Irregular periods  Para  0  Other Problems (Alisha Pace, CMA; 09/03/2020 10:59 AM) Anxiety Disorder  Back Pain     Review of Systems (Alisha Pace CMA; 09/03/2020 10:59 AM) General Not Present- Appetite Loss, Chills, Fatigue, Fever, Night Sweats, Weight Gain and Weight Loss. Skin Not Present- Change in Wart/Mole, Dryness, Hives, Jaundice, New Lesions, Non-Healing Wounds, Rash and Ulcer. HEENT Present- Wears glasses/contact lenses. Not Present- Earache, Hearing Loss, Hoarseness, Nose Bleed, Oral Ulcers, Ringing in the Ears, Seasonal Allergies, Sinus Pain, Sore Throat, Visual  Disturbances and Yellow Eyes. Respiratory Not Present- Bloody sputum, Chronic Cough, Difficulty Breathing, Snoring and Wheezing. Breast Not Present- Breast Mass, Breast Pain, Nipple Discharge and Skin Changes. Cardiovascular Not Present- Chest Pain, Difficulty Breathing Lying Down, Leg Cramps, Palpitations, Rapid Heart Rate, Shortness of Breath and Swelling of Extremities. Gastrointestinal Present- Abdominal Pain, Bloating and Nausea. Not Present- Bloody Stool, Change in Bowel Habits, Chronic diarrhea, Constipation, Difficulty Swallowing, Excessive gas, Gets full quickly at meals, Hemorrhoids, Indigestion, Rectal Pain and Vomiting. Female Genitourinary Not Present- Frequency, Nocturia, Painful Urination, Pelvic Pain and Urgency. Musculoskeletal Present- Back Pain. Not Present- Joint Pain, Joint Stiffness, Muscle Pain, Muscle Weakness and Swelling of Extremities. Neurological Present- Headaches. Not Present- Decreased Memory, Fainting, Numbness, Seizures, Tingling, Tremor, Trouble walking and Weakness. Psychiatric Present- Anxiety. Not Present- Bipolar, Change in Sleep Pattern, Depression, Fearful and Frequent crying. Endocrine Not Present- Cold Intolerance, Excessive Hunger, Hair Changes, Heat Intolerance, Hot flashes and New Diabetes. Hematology Not Present- Blood Thinners, Easy Bruising, Excessive bleeding, Gland problems, HIV and Persistent Infections.  Vitals (Alisha Pace CMA; 09/03/2020 11:00 AM) 09/03/2020 10:59 AM Weight: 321 lb Height: 67in Body Surface Area: 2.47 m Body Mass Index: 50.28 kg/m  Pulse: 118 (Regular)  BP: 152/82(Sitting, Left Arm, Standard)       Physical Exam Emily Sportsman MD; 09/03/2020 11:31 AM) General Mental Status-Alert. General Appearance-Not in acute distress, Not Sickly. Orientation-Oriented X3. Hydration-Well hydrated. Voice-Normal.  Integumentary Global Assessment Upon inspection and palpation of skin surfaces of the -  Axillae: non-tender, no inflammation or ulceration, no drainage. and Distribution of scalp and body hair is normal. General Characteristics Temperature - normal warmth is noted.  Head and Neck Head-normocephalic, atraumatic with no lesions or palpable masses. Face Global Assessment - atraumatic, no absence of expression. Neck Global Assessment - no abnormal movements, no bruit auscultated on the right, no bruit auscultated on the left, no decreased range of motion, non-tender. Trachea-midline. Thyroid Gland Characteristics - non-tender.  Eye Eyeball - Left-Extraocular movements intact, No Nystagmus - Left. Eyeball - Right-Extraocular movements intact, No Nystagmus - Right. Cornea - Left-No Hazy - Left. Cornea - Right-No Hazy - Right. Sclera/Conjunctiva - Left-No scleral icterus, No Discharge - Left. Sclera/Conjunctiva - Right-No scleral icterus, No Discharge - Right. Pupil - Left-Direct reaction to light normal. Pupil - Right-Direct reaction to light normal.  ENMT Ears Pinna - Left - no drainage observed, no generalized tenderness observed. Pinna - Right - no drainage observed, no generalized tenderness observed. Nose and Sinuses External Inspection of the Nose - no destructive lesion observed. Inspection of the nares - Left - quiet respiration. Inspection of the nares - Right - quiet respiration. Mouth and Throat Lips - Upper Lip - no fissures observed, no pallor noted. Lower Lip - no fissures observed, no pallor noted. Nasopharynx - no discharge present. Oral Cavity/Oropharynx - Tongue - no dryness observed. Oral Mucosa - no cyanosis observed. Hypopharynx - no evidence of airway distress observed.  Chest and Lung Exam Inspection Movements - Normal and Symmetrical. Accessory muscles - No use of accessory muscles in breathing. Palpation Palpation of the chest reveals - Non-tender. Auscultation Breath sounds - Normal and  Clear.  Cardiovascular Auscultation Rhythm - Regular. Murmurs & Other Heart Sounds - Auscultation of the heart reveals - No Murmurs and No Systolic Clicks.  Abdomen Inspection Inspection of the abdomen reveals - No Visible peristalsis and No Abnormal pulsations. Umbilicus - No Bleeding, No Urine drainage. Palpation/Percussion Palpation and Percussion of the abdomen reveal - Soft, Non Tender, No  Rebound tenderness, No Rigidity (guarding) and No Cutaneous hyperesthesia. Note: Abdomen soft. Tenderness in right upper quadrant along subcostal ridge but no true Murphy sign. Rest the abdomen obese and soft and nontender.Not severely distended. No diastasis recti. No umbilical or other anterior abdominal wall hernias   Female Genitourinary Sexual Maturity Tanner 5 - Adult hair pattern. Note: No vaginal bleeding nor discharge   Peripheral Vascular Upper Extremity Inspection - Left - No Cyanotic nailbeds - Left, Not Ischemic. Inspection - Right - No Cyanotic nailbeds - Right, Not Ischemic.  Neurologic Neurologic evaluation reveals -normal attention span and ability to concentrate, able to name objects and repeat phrases. Appropriate fund of knowledge , normal sensation and normal coordination. Mental Status Affect - not angry, not paranoid. Cranial Nerves-Normal Bilaterally. Gait-Normal.  Neuropsychiatric Mental status exam performed with findings of-able to articulate well with normal speech/language, rate, volume and coherence, thought content normal with ability to perform basic computations and apply abstract reasoning and no evidence of hallucinations, delusions, obsessions or homicidal/suicidal ideation.  Musculoskeletal Global Assessment Spine, Ribs and Pelvis - no instability, subluxation or laxity. Right Upper Extremity - no instability, subluxation or laxity.  Lymphatic Head & Neck  General Head & Neck Lymphatics: Bilateral - Description - No Localized  lymphadenopathy. Axillary  General Axillary Region: Bilateral - Description - No Localized lymphadenopathy. Femoral & Inguinal  Generalized Femoral & Inguinal Lymphatics: Left - Description - No Localized lymphadenopathy. Right - Description - No Localized lymphadenopathy.    Assessment & Plan Emily Sportsman MD; 09/03/2020 1:04 PM) POSTPRANDIAL RUQ PAIN (R10.11) Impression: Rather classic story of biliary colic with postprandial pain with fast food triggers. She's had less attacks and less intensity since she's transition to a lower fat diet, she still getting symptoms.  However, she has had no labs or studies yet.  She requires an ultrasound to see if she has stones. If she has gallstones, offer cholecystectomy. If she does not, then nuclear medicine HIDA scan with gallbladder ejection fraction to see if she has chronic acalculous cholecystitis/biliary dyskinesia. If she has normal gallbladder functioning without reproduction of symptoms, then see gastroenterology to rule out other possible etiologies.  Also order lab work.  I believe she is due to follow up with her primary care office next week. We can see if we can get the labs and studies done in the meantime something, but the decision. Current Plans Follow Up - Call [facility] office after tests / studies doneto discuss further plans Written instructions provided Pt Education - Pamphlet Given - Laparoscopic Gallbladder Surgery: discussed with patient and provided information. The anatomy & physiology of hepatobiliary & pancreatic function was discussed. The pathophysiology of gallbladder dysfunction was discussed. Natural history risks without surgery was discussed. I feel the risks of no intervention will lead to serious problems that outweigh the operative risks; therefore, I recommended cholecystectomy to remove the pathology. I explained laparoscopic techniques with possible need for an open approach. Probable cholangiogram to  evaluate the bilary tract was explained as well.  Risks such as bleeding, infection, abscess, leak, injury to other organs, need for further treatment, heart attack, death, and other risks were discussed. I noted a good likelihood this will help address the problem. Possibility that this will not correct all abdominal symptoms was explained. Goals of post-operative recovery were discussed as well. We will work to minimize complications. An educational handout further explaining the pathology and treatment options was given as well. Questions were answered. The patient expresses understanding & wishes  to proceed with surgery.  Pt Education - [facility] Laparosopic Post Op HCI (Idalee Foxworthy) Pt Education - [facility] Good Bowel Health (Stephaun Million) CBC, PLATELETS & AUT DIFF ([id number]) METABOLIC PANEL, COMPREHENSIVE (80053) LIPASE (84696(83690) RUQ US (RIGHT UPPER QUADRANT ULTRASOUND) ([id number]) (Pt needs RUQ u/s to eval. RUQ abdominal pain/ R/O gallstones)  ADDENDUM:  Ultrasound shows no gallstones with fatty liver.  Nuclear medicine HIDA scan study shows gallbladder ejection fraction 30% with reproduction of symptoms.  Probable biliary dyskinesia I have offered cholecystectomy.  Emily SportsmanSteven C. Estephan Gallardo, MD, FACS, MASCRS Gastrointestinal and Minimally Invasive Surgery  [facility] [address] [place], [place] [id number] [phone] Fax [phone] Main/Paging  CONTACT INFORMATION: Weekday (9AM-5PM) concerns: Call [facility] main office at [phone] Weeknight (5PM-9AM) or Weekend/Holiday concerns: Check [email or web address] for General Surgery [facility] coverage (Please, do not use SecureChat as it is not reliable communication to operating surgeons for immediate patient care)

## 2020-12-30 NOTE — Progress Notes (Signed)
DUE TO COVID-19 ONLY ONE VISITOR IS ALLOWED TO COME WITH YOU AND STAY IN THE WAITING ROOM ONLY DURING PRE OP AND PROCEDURE DAY OF SURGERY. THE 1 VISITOR  MAY VISIT WITH YOU AFTER SURGERY IN YOUR PRIVATE ROOM DURING VISITING HOURS ONLY!  YOU NEED TO HAVE A COVID 19 TEST ON__3/22/2022 _____ @_______ , THIS TEST MUST BE DONE BEFORE SURGERY,  COVID TESTING SITE [address] [place] , IT IS ON THE RIGHT GOING OUT [address] APPROXIMATELY  2 MINUTES PAST [employer] ON THE RIGHT. ONCE YOUR COVID TEST IS COMPLETED,  PLEASE BEGIN THE QUARANTINE INSTRUCTIONS AS OUTLINED IN YOUR HANDOUT.                Emily Pace  12/30/2020   Your procedure is scheduled on: 01/10/2021    Report to [facility] Main  Entrance   Report to admitting at   0800  AM     Call this number if you have problems the morning of surgery [phone]    REMEMBER: NO  SOLID FOOD CANDY OR GUM AFTER MIDNIGHT. CLEAR LIQUIDS UNTIL   0700AM      . NOTHING BY MOUTH EXCEPT CLEAR LIQUIDS UNTIL      0700AM   . PLEASE FINISH ENSURE DRINK PER SURGEON ORDER  WHICH NEEDS TO BE COMPLETED AT      . 09-28-1996      CLEAR LIQUID DIET   Foods Allowed                                                                    Coffee and tea, regular and decaf                            Fruit ices (not with fruit pulp)                                      Iced Popsicles                                    Carbonated beverages, regular and diet                                    Cranberry, grape and apple juices Sports drinks like Gatorade Lightly seasoned clear broth or consume(fat free) Sugar, honey syrup ___________________________________________________________________      BRUSH YOUR TEETH MORNING OF SURGERY AND RINSE YOUR MOUTH OUT, NO CHEWING GUM CANDY OR MINTS.     Take these medicines the morning of surgery with A SIP OF WATER: NONE DO NOT TAKE ANY DIABETIC MEDICATIONS DAY OF YOUR SURGERY                                You may not have any metal on your body including hair pins and              piercings  Do not wear jewelry, make-up, lotions, powders or perfumes, deodorant             Do not wear nail polish on your fingernails.  Do not shave  48 hours prior to surgery.              Men may shave face and neck.   Do not bring valuables to the hospital. [place].  Contacts, dentures or bridgework may not be worn into surgery.  Leave suitcase in the car. After surgery it may be brought to your room.     Patients discharged the day of surgery will not be allowed to drive home. IF YOU ARE HAVING SURGERY AND GOING HOME THE SAME DAY, YOU MUST HAVE AN ADULT TO DRIVE YOU HOME AND BE WITH YOU FOR 24 HOURS. YOU MAY GO HOME BY TAXI OR UBER OR ORTHERWISE, BUT AN ADULT MUST ACCOMPANY YOU HOME AND STAY WITH YOU FOR 24 HOURS.  Name and phone number of your driver:  Special Instructions: N/A              Please read over the following fact sheets you were given: _____________________________________________________________________  [facility] - Preparing for Surgery Before surgery, you can play an important role.  Because skin is not sterile, your skin needs to be as free of germs as possible.  You can reduce the number of germs on your skin by washing with CHG (chlorahexidine gluconate) soap before surgery.  CHG is an antiseptic cleaner which kills germs and bonds with the skin to continue killing germs even after washing. Please DO NOT use if you have an allergy to CHG or antibacterial soaps.  If your skin becomes reddened/irritated stop using the CHG and inform your nurse when you arrive at Short Stay. Do not shave (including legs and underarms) for at least 48 hours prior to the first CHG shower.  You may shave your face/neck. Please follow these instructions carefully:  1.  Shower with CHG Soap the night before surgery and the  morning of  Surgery.  2.  If you choose to wash your hair, wash your hair first as usual with your  normal  shampoo.  3.  After you shampoo, rinse your hair and body thoroughly to remove the  shampoo.                           4.  Use CHG as you would any other liquid soap.  You can apply chg directly  to the skin and wash                       Gently with a scrungie or clean washcloth.  5.  Apply the CHG Soap to your body ONLY FROM THE NECK DOWN.   Do not use on face/ open                           Wound or open sores. Avoid contact with eyes, ears mouth and genitals (private parts).                       Wash face,  Genitals (private parts) with your normal soap.             6.  Wash  thoroughly, paying special attention to the area where your surgery  will be performed.  7.  Thoroughly rinse your body with warm water from the neck down.  8.  DO NOT shower/wash with your normal soap after using and rinsing off  the CHG Soap.                9.  Pat yourself dry with a clean towel.            10.  Wear clean pajamas.            11.  Place clean sheets on your bed the night of your first shower and do not  sleep with pets. Day of Surgery : Do not apply any lotions/deodorants the morning of surgery.  Please wear clean clothes to the hospital/surgery center.  FAILURE TO FOLLOW THESE INSTRUCTIONS MAY RESULT IN THE CANCELLATION OF YOUR SURGERY PATIENT SIGNATURE_________________________________  NURSE SIGNATURE__________________________________  ________________________________________________________________________

## 2021-01-02 ENCOUNTER — Other Ambulatory Visit: Payer: Self-pay

## 2021-01-02 ENCOUNTER — Encounter (HOSPITAL_COMMUNITY): Payer: Self-pay

## 2021-01-02 ENCOUNTER — Encounter (HOSPITAL_COMMUNITY)
Admission: RE | Admit: 2021-01-02 | Discharge: 2021-01-02 | Disposition: A | Discharge status: Home / Self Care | Payer: 59 | Source: Ambulatory Visit | Attending: Surgery | Admitting: Surgery

## 2021-01-02 DIAGNOSIS — Z01818 Encounter for other preprocedural examination: Secondary | ICD-10-CM | POA: Diagnosis present

## 2021-01-02 DIAGNOSIS — K8050 Calculus of bile duct without cholangitis or cholecystitis without obstruction: Secondary | ICD-10-CM | POA: Insufficient documentation

## 2021-01-02 HISTORY — DX: Depression, unspecified: F32.A

## 2021-01-02 HISTORY — DX: Anxiety disorder, unspecified: F41.9

## 2021-01-02 HISTORY — DX: Anemia, unspecified: D64.9

## 2021-01-02 HISTORY — DX: Headache, unspecified: R51.9

## 2021-01-02 LAB — CBC
HCT: 44 % (ref 36.0–46.0)
Hemoglobin: 13.7 g/dL (ref 12.0–15.0)
MCH: 24.8 pg — ABNORMAL LOW (ref 26.0–34.0)
MCHC: 31.1 g/dL (ref 30.0–36.0)
MCV: 79.7 fL — ABNORMAL LOW (ref 80.0–100.0)
Platelets: 311 10*3/uL (ref 150–400)
RBC: 5.52 MIL/uL — ABNORMAL HIGH (ref 3.87–5.11)
RDW: 20.8 % — ABNORMAL HIGH (ref 11.5–15.5)
WBC: 9.2 10*3/uL (ref 4.0–10.5)
nRBC: 0 % (ref 0.0–0.2)

## 2021-01-02 LAB — BASIC METABOLIC PANEL
Anion gap: 7 (ref 5–15)
BUN: 11 mg/dL (ref 6–20)
CO2: 27 mmol/L (ref 22–32)
Calcium: 9.1 mg/dL (ref 8.9–10.3)
Chloride: 104 mmol/L (ref 98–111)
Creatinine, Ser: 0.74 mg/dL (ref 0.44–1.00)
GFR, Estimated: >60 mL/min (ref >60)
Glucose, Bld: 86 mg/dL (ref 70–99)
Potassium: 3.9 mmol/L (ref 3.5–5.1)
Sodium: 138 mmol/L (ref 135–145)

## 2021-01-02 NOTE — Progress Notes (Signed)
Anesthesia Review:  PCP: Dr Lowella Petties /DR Tracey Harries  Telemedicine visits on 11/22/20 and 11/26/20 .  REsults on chart.  Cardiologist : none Chest x-ray : EKG :01/02/21 Echo : Stress test: Cardiac Cath :  Activity level: can climb a flight of stiars without difficulty  Sleep Study/ CPAP :none  Fasting Blood Sugar :      / Checks Blood Sugar -- times a day:   Blood Thinner/ Instructions /Last Dose: ASA / Instructions/ Last Dose :  See progress note for vaso vagal  episode at preop appt of 01/02/21.   PT had positive  home test for covid on 11/26/20 and had telemedicine visits on 11/22/20 and 11/26/20 related to positive home covid test.  Emily Pace made aware .  No preop covid test scheduled in regards to surgery of 01/10/21.   Initial temp at preop was 100.4 and 2 rechecks were done of 99.3 and 99.1.  Emily Pace made aware.  Pt instructed that if has fever and/or N/V at home to call surgeon per instructions of Emily Zanetto,PA.   PT voices understanding.   Blood pressure at preop was 160/83.

## 2021-01-02 NOTE — Progress Notes (Signed)
After nurse interview at preop appt pt had labs drawn.  PT had vasovagal episode with blood draw.  Nurses in dept along witn Gayla Medicus saw pt .  Mother in waiting room with pt at this time.  Jodell Cipro, PA spoke with mother and pt.  Vital signs 157/100 sat 100 pulse 84.  Pt given fluids to dirnk and tolerated well.  Mother still present.  Cbc and bmp drawn.  EKg done.  PT walked from one room to another for ekg.  Mother to drive pt home.  Jodell Cipro, PA gave mother instructions that if she does not feell well at home to take to ER.  Gayla Medicus saw EKG reading.  Pt discharged to home with mother.  Pt plans to get something to eat and drink after preop appt.

## 2021-01-03 LAB — GLUCOSE, CAPILLARY: Glucose-Capillary: 94 mg/dL (ref 70–99)

## 2021-01-03 NOTE — Progress Notes (Addendum)
Anesthesia Chart Review   Case: [id number] Date/Time: 01/10/21 0915   Procedures:      LAPAROSCOPIC CHOLECYSTECTOMY WITH INTRAOPERATIVE CHOLANGIOGRAM (N/A )     POSSIBLE NEEDLE CORE BIOPSY OF LIVER (N/A )   Anesthesia type: General   Pre-op diagnosis: SYMPTOMATIC BILIARY COLIC, PROBABLE CHRONIC CHOLECYSTITIS   Location: [facility] ROOM 04 / [facility]   Surgeons: Karie Soda, MD      DISCUSSION:25 y.o. never smoker with symptomatic biliary colic scheduled for above procedure 01/10/2021 with Dr. Karie Soda.   Pt tested positive for COVID 11/20/20 per PCP notes 11/22/2020. No need for retest prior to above procedure, tested positive within the last 90 days.   During blood draw pt became warm/flushed and slumped over in the chair regaining consciousness after about 10 seconds. Pt alert and oriented x3. Vitals WNL. Pt asymptomatic. She reports she has experienced vasovagal episodes during blood draws in the past. Mother drove her here today. Sx consistent with vasovagal response due to blood draw, consistent with previous episodes.   VS: BP (!) 160/83   Pulse 83   Temp 37.3 C (Oral)   Resp 16   SpO2 99%   PROVIDERS: Morrell Riddle, PA-C is PCP    LABS: Labs reviewed: Acceptable for surgery. (all labs ordered are listed, but only abnormal results are displayed)  Labs Reviewed  CBC - Abnormal; Notable for the following components:      Result Value   RBC 5.52 (*)    MCV 79.7 (*)    MCH 24.8 (*)    RDW 20.8 (*)    All other components within normal limits  BASIC METABOLIC PANEL  GLUCOSE, CAPILLARY     IMAGES:   EKG:   CV:  Past Medical History:  Diagnosis Date  . ADHD (attention deficit hyperactivity disorder)    Followed by Dr. Kem Kays.  Started vyvanse at age 35yrs  . Anemia    hx of iron infusion .  last one 12/26/20   . Anxiety   . Constipation   . Depression   . Headache   . Obesity   . Otitis media   . Urinary tract infection   . Vision abnormalities     Past  Surgical History:  Procedure Laterality Date  . none    . WISDOM TOOTH EXTRACTION      MEDICATIONS: . AVIANE 0.1-20 MG-MCG tablet  . benzonatate (TESSALON) 100 MG capsule  . celecoxib (CELEBREX) 200 MG capsule  . ferrous gluconate (FERGON) 324 MG tablet  . sertraline (ZOLOFT) 100 MG tablet   No current facility-administered medications for this encounter.    Jodell Cipro, PA-C [facility] Pre-Surgical Testing [id number]

## 2021-01-07 ENCOUNTER — Other Ambulatory Visit (HOSPITAL_COMMUNITY): Payer: 59

## 2021-01-09 MED ORDER — BUPIVACAINE LIPOSOME 1.3 % IJ SUSP
20.0000 mL | Freq: Once | INTRAMUSCULAR | Status: DC
  Filled 2021-01-09: qty 20

## 2021-01-09 NOTE — Anesthesia Preprocedure Evaluation (Addendum)
Anesthesia Evaluation  Patient identified by MRN, date of birth, ID band Patient awake    Reviewed: Allergy & Precautions, H&P , NPO status , Patient's Chart, lab work & pertinent test results  Airway Mallampati: II  TM Distance: >3 FB Neck ROM: Full    Dental no notable dental hx. (+) Teeth Intact, Dental Advisory Given   Pulmonary neg pulmonary ROS,    Pulmonary exam normal breath sounds clear to auscultation       Cardiovascular Exercise Tolerance: Good negative cardio ROS   Rhythm:Regular Rate:Normal     Neuro/Psych  Headaches, Anxiety Depression    GI/Hepatic negative GI ROS, Neg liver ROS,   Endo/Other  negative endocrine ROS  Renal/GU negative Renal ROS  negative genitourinary   Musculoskeletal   Abdominal   Peds  (+) ADHD Hematology  (+) Blood dyscrasia, anemia ,   Anesthesia Other Findings   Reproductive/Obstetrics negative OB ROS                            Anesthesia Physical Anesthesia Plan  ASA: II  Anesthesia Plan: General   Post-op Pain Management:    Induction: Intravenous  PONV Risk Score and Plan: 4 or greater and Ondansetron, Dexamethasone and Midazolam  Airway Management Planned: Oral ETT  Additional Equipment:   Intra-op Plan:   Post-operative Plan: Extubation in OR  Informed Consent: I have reviewed the patients History and Physical, chart, labs and discussed the procedure including the risks, benefits and alternatives for the proposed anesthesia with the patient or authorized representative who has indicated his/her understanding and acceptance.     Dental advisory given  Plan Discussed with: CRNA  Anesthesia Plan Comments:        Anesthesia Quick Evaluation

## 2021-01-10 ENCOUNTER — Ambulatory Visit (HOSPITAL_COMMUNITY): Payer: 59 | Admitting: Certified Registered Nurse Anesthetist

## 2021-01-10 ENCOUNTER — Ambulatory Visit (HOSPITAL_COMMUNITY)
Admission: RE | Admit: 2021-01-10 | Discharge: 2021-01-10 | Disposition: A | Discharge status: Home / Self Care | Payer: 59 | Attending: Surgery | Admitting: Surgery

## 2021-01-10 ENCOUNTER — Encounter (HOSPITAL_COMMUNITY)
Admission: RE | Disposition: A | Discharge status: Home / Self Care | Payer: Self-pay | Source: Home / Self Care | Attending: Surgery

## 2021-01-10 ENCOUNTER — Ambulatory Visit (HOSPITAL_COMMUNITY): Payer: 59 | Admitting: Physician Assistant

## 2021-01-10 ENCOUNTER — Encounter (HOSPITAL_COMMUNITY): Payer: Self-pay | Admitting: Surgery

## 2021-01-10 ENCOUNTER — Ambulatory Visit (HOSPITAL_COMMUNITY): Payer: 59

## 2021-01-10 DIAGNOSIS — Z881 Allergy status to other antibiotic agents status: Secondary | ICD-10-CM | POA: Diagnosis not present

## 2021-01-10 DIAGNOSIS — Z79899 Other long term (current) drug therapy: Secondary | ICD-10-CM | POA: Insufficient documentation

## 2021-01-10 DIAGNOSIS — K811 Chronic cholecystitis: Secondary | ICD-10-CM | POA: Insufficient documentation

## 2021-01-10 DIAGNOSIS — J40 Bronchitis, not specified as acute or chronic: Secondary | ICD-10-CM | POA: Insufficient documentation

## 2021-01-10 DIAGNOSIS — Z419 Encounter for procedure for purposes other than remedying health state, unspecified: Secondary | ICD-10-CM

## 2021-01-10 DIAGNOSIS — K5900 Constipation, unspecified: Secondary | ICD-10-CM | POA: Insufficient documentation

## 2021-01-10 DIAGNOSIS — IMO0001 Reserved for inherently not codable concepts without codable children: Secondary | ICD-10-CM | POA: Insufficient documentation

## 2021-01-10 DIAGNOSIS — Z880 Allergy status to penicillin: Secondary | ICD-10-CM | POA: Insufficient documentation

## 2021-01-10 DIAGNOSIS — J309 Allergic rhinitis, unspecified: Secondary | ICD-10-CM | POA: Insufficient documentation

## 2021-01-10 DIAGNOSIS — Z793 Long term (current) use of hormonal contraceptives: Secondary | ICD-10-CM | POA: Insufficient documentation

## 2021-01-10 DIAGNOSIS — Z91048 Other nonmedicinal substance allergy status: Secondary | ICD-10-CM | POA: Diagnosis not present

## 2021-01-10 DIAGNOSIS — F419 Anxiety disorder, unspecified: Secondary | ICD-10-CM | POA: Insufficient documentation

## 2021-01-10 HISTORY — PX: CHOLECYSTECTOMY: SHX55

## 2021-01-10 LAB — PREGNANCY, URINE: Preg Test, Ur: NEGATIVE

## 2021-01-10 SURGERY — LAPAROSCOPIC CHOLECYSTECTOMY WITH INTRAOPERATIVE CHOLANGIOGRAM
Anesthesia: General

## 2021-01-10 MED ORDER — DEXAMETHASONE SODIUM PHOSPHATE 10 MG/ML IJ SOLN
INTRAMUSCULAR | Status: DC | PRN
  Administered 2021-01-10: 6 mg via INTRAVENOUS

## 2021-01-10 MED ORDER — LIDOCAINE 2% (20 MG/ML) 5 ML SYRINGE
INTRAMUSCULAR | Status: DC | PRN
  Administered 2021-01-10: 60 mg via INTRAVENOUS

## 2021-01-10 MED ORDER — DEXAMETHASONE SODIUM PHOSPHATE 10 MG/ML IJ SOLN
INTRAMUSCULAR | Status: AC
  Filled 2021-01-10: qty 1

## 2021-01-10 MED ORDER — CHLORHEXIDINE GLUCONATE CLOTH 2 % EX PADS: 6.0000 | MEDICATED_PAD | Freq: Once | CUTANEOUS | Status: DC

## 2021-01-10 MED ORDER — FENTANYL CITRATE (PF) 100 MCG/2ML IJ SOLN
INTRAMUSCULAR | Status: AC
  Filled 2021-01-10: qty 2

## 2021-01-10 MED ORDER — ENSURE PRE-SURGERY PO LIQD
296.0000 mL | Freq: Once | ORAL | Status: DC
  Filled 2021-01-10: qty 296

## 2021-01-10 MED ORDER — HYDROMORPHONE HCL 1 MG/ML IJ SOLN: 0.2500 mg | INTRAMUSCULAR | Status: DC | PRN

## 2021-01-10 MED ORDER — DIPHENHYDRAMINE HCL 50 MG/ML IJ SOLN
INTRAMUSCULAR | Status: DC | PRN
  Administered 2021-01-10: 12.5 mg via INTRAVENOUS

## 2021-01-10 MED ORDER — PROPOFOL 10 MG/ML IV BOLUS
INTRAVENOUS | Status: AC
  Filled 2021-01-10: qty 20

## 2021-01-10 MED ORDER — BUPIVACAINE LIPOSOME 1.3 % IJ SUSP
INTRAMUSCULAR | Status: DC | PRN
  Administered 2021-01-10: 20 mL

## 2021-01-10 MED ORDER — ONDANSETRON HCL 4 MG/2ML IJ SOLN
INTRAMUSCULAR | Status: AC
  Filled 2021-01-10: qty 2

## 2021-01-10 MED ORDER — LIDOCAINE 2% (20 MG/ML) 5 ML SYRINGE
INTRAMUSCULAR | Status: AC
  Filled 2021-01-10: qty 5

## 2021-01-10 MED ORDER — BUPIVACAINE-EPINEPHRINE 0.25% -1:200000 IJ SOLN
INTRAMUSCULAR | Status: DC | PRN
  Administered 2021-01-10: 60 mL

## 2021-01-10 MED ORDER — ROCURONIUM BROMIDE 10 MG/ML (PF) SYRINGE
PREFILLED_SYRINGE | INTRAVENOUS | Status: AC
  Filled 2021-01-10: qty 10

## 2021-01-10 MED ORDER — IOPAMIDOL (ISOVUE-300) INJECTION 61%
INTRAVENOUS | Status: DC | PRN
  Administered 2021-01-10: 7.5 mL

## 2021-01-10 MED ORDER — LACTATED RINGERS IR SOLN
Status: DC | PRN
  Administered 2021-01-10: 1000 mL

## 2021-01-10 MED ORDER — GABAPENTIN 300 MG PO CAPS
300.0000 mg | ORAL_CAPSULE | ORAL | Status: AC
  Administered 2021-01-10: 300 mg via ORAL
  Filled 2021-01-10: qty 1

## 2021-01-10 MED ORDER — BUPIVACAINE-EPINEPHRINE (PF) 0.25% -1:200000 IJ SOLN
INTRAMUSCULAR | Status: AC
  Filled 2021-01-10: qty 30

## 2021-01-10 MED ORDER — MIDAZOLAM HCL 5 MG/5ML IJ SOLN
INTRAMUSCULAR | Status: DC | PRN
  Administered 2021-01-10: 2 mg via INTRAVENOUS

## 2021-01-10 MED ORDER — ORAL CARE MOUTH RINSE: 15.0000 mL | Freq: Once | OROMUCOSAL | Status: AC

## 2021-01-10 MED ORDER — TRAMADOL HCL 50 MG PO TABS: 50.0000 mg | ORAL_TABLET | Freq: Four times a day (QID) | ORAL | 0 refills | Status: AC | PRN

## 2021-01-10 MED ORDER — ACETAMINOPHEN 500 MG PO TABS
1000.0000 mg | ORAL_TABLET | ORAL | Status: AC
  Administered 2021-01-10: 1000 mg via ORAL
  Filled 2021-01-10: qty 2

## 2021-01-10 MED ORDER — CHLORHEXIDINE GLUCONATE 0.12 % MT SOLN
15.0000 mL | Freq: Once | OROMUCOSAL | Status: AC
  Administered 2021-01-10: 15 mL via OROMUCOSAL

## 2021-01-10 MED ORDER — PROPOFOL 10 MG/ML IV BOLUS
INTRAVENOUS | Status: DC | PRN
  Administered 2021-01-10: 200 mg via INTRAVENOUS

## 2021-01-10 MED ORDER — MIDAZOLAM HCL 2 MG/2ML IJ SOLN
INTRAMUSCULAR | Status: AC
  Filled 2021-01-10: qty 2

## 2021-01-10 MED ORDER — SODIUM CHLORIDE 0.9 % IR SOLN
Status: DC | PRN
  Administered 2021-01-10: 1000 mL

## 2021-01-10 MED ORDER — FENTANYL CITRATE (PF) 100 MCG/2ML IJ SOLN
INTRAMUSCULAR | Status: DC | PRN
  Administered 2021-01-10: 50 ug via INTRAVENOUS
  Administered 2021-01-10: 100 ug via INTRAVENOUS
  Administered 2021-01-10: 50 ug via INTRAVENOUS

## 2021-01-10 MED ORDER — SUGAMMADEX SODIUM 500 MG/5ML IV SOLN
INTRAVENOUS | Status: AC
  Filled 2021-01-10: qty 5

## 2021-01-10 MED ORDER — DEXMEDETOMIDINE (PRECEDEX) IN NS 20 MCG/5ML (4 MCG/ML) IV SYRINGE
PREFILLED_SYRINGE | INTRAVENOUS | Status: DC | PRN
  Administered 2021-01-10: 8 ug via INTRAVENOUS

## 2021-01-10 MED ORDER — CELECOXIB 200 MG PO CAPS
200.0000 mg | ORAL_CAPSULE | ORAL | Status: AC
  Administered 2021-01-10: 200 mg via ORAL
  Filled 2021-01-10: qty 1

## 2021-01-10 MED ORDER — LACTATED RINGERS IV SOLN: INTRAVENOUS | Status: DC

## 2021-01-10 MED ORDER — SUGAMMADEX SODIUM 500 MG/5ML IV SOLN
INTRAVENOUS | Status: DC | PRN
  Administered 2021-01-10: 500 mg via INTRAVENOUS

## 2021-01-10 MED ORDER — ROCURONIUM BROMIDE 10 MG/ML (PF) SYRINGE
PREFILLED_SYRINGE | INTRAVENOUS | Status: DC | PRN
  Administered 2021-01-10: 10 mg via INTRAVENOUS
  Administered 2021-01-10: 60 mg via INTRAVENOUS
  Administered 2021-01-10: 10 mg via INTRAVENOUS
  Administered 2021-01-10: 20 mg via INTRAVENOUS

## 2021-01-10 MED ORDER — ONDANSETRON HCL 4 MG/2ML IJ SOLN
INTRAMUSCULAR | Status: DC | PRN
  Administered 2021-01-10: 4 mg via INTRAVENOUS

## 2021-01-10 SURGICAL SUPPLY — 43 items
APPLIER CLIP 5 13 M/L LIGAMAX5 (MISCELLANEOUS) ×2
APPLIER CLIP ROT 10 11.4 M/L (STAPLE)
CABLE HIGH FREQUENCY MONO STRZ (ELECTRODE) IMPLANT
CLIP APPLIE 5 13 M/L LIGAMAX5 (MISCELLANEOUS) ×1 IMPLANT
CLIP APPLIE ROT 10 11.4 M/L (STAPLE) IMPLANT
COVER MAYO STAND STRL (DRAPES) ×2 IMPLANT
COVER SURGICAL LIGHT HANDLE (MISCELLANEOUS) ×2 IMPLANT
COVER WAND RF STERILE (DRAPES) ×2 IMPLANT
DECANTER SPIKE VIAL GLASS SM (MISCELLANEOUS) ×2 IMPLANT
DRAPE C-ARM 42X120 X-RAY (DRAPES) ×2 IMPLANT
DRAPE UTILITY XL STRL (DRAPES) ×2 IMPLANT
DRAPE WARM FLUID 44X44 (DRAPES) ×2 IMPLANT
DRSG TEGADERM 2-3/8X2-3/4 SM (GAUZE/BANDAGES/DRESSINGS) ×2 IMPLANT
DRSG TEGADERM 4X4.75 (GAUZE/BANDAGES/DRESSINGS) ×2 IMPLANT
ELECT REM PT RETURN 15FT ADLT (MISCELLANEOUS) ×2 IMPLANT
ENDOLOOP SUT PDS II  0 18 (SUTURE)
ENDOLOOP SUT PDS II 0 18 (SUTURE) IMPLANT
GAUZE SPONGE 2X2 8PLY STRL LF (GAUZE/BANDAGES/DRESSINGS) ×1 IMPLANT
GLOVE SURG LTX SZ8 (GLOVE) ×2 IMPLANT
GLOVE SURG UNDER LTX SZ8 (GLOVE) ×2 IMPLANT
GOWN STRL REUS W/TWL XL LVL3 (GOWN DISPOSABLE) ×4 IMPLANT
IRRIG SUCT STRYKERFLOW 2 WTIP (MISCELLANEOUS) ×2
IRRIGATION SUCT STRKRFLW 2 WTP (MISCELLANEOUS) ×1 IMPLANT
KIT BASIN OR (CUSTOM PROCEDURE TRAY) ×2 IMPLANT
KIT TURNOVER KIT A (KITS) ×2 IMPLANT
NEEDLE SPNL 22GX3.5 QUINCKE BK (NEEDLE) ×2 IMPLANT
PENCIL SMOKE EVACUATOR (MISCELLANEOUS) IMPLANT
POUCH RETRIEVAL ECOSAC 10 (ENDOMECHANICALS) ×1 IMPLANT
POUCH RETRIEVAL ECOSAC 10MM (ENDOMECHANICALS) ×2
SCISSORS LAP 5X35 DISP (ENDOMECHANICALS) ×2 IMPLANT
SET CHOLANGIOGRAPH MIX (MISCELLANEOUS) ×2 IMPLANT
SET TUBE SMOKE EVAC HIGH FLOW (TUBING) ×2 IMPLANT
SLEEVE XCEL OPT CAN 5 100 (ENDOMECHANICALS) IMPLANT
SPONGE GAUZE 2X2 STER 10/PKG (GAUZE/BANDAGES/DRESSINGS) ×1
SUT MNCRL AB 4-0 PS2 18 (SUTURE) ×2 IMPLANT
SUT PDS AB 1 CT1 27 (SUTURE) ×4 IMPLANT
SYR 20ML LL LF (SYRINGE) ×2 IMPLANT
TOWEL OR 17X26 10 PK STRL BLUE (TOWEL DISPOSABLE) ×2 IMPLANT
TOWEL OR NON WOVEN STRL DISP B (DISPOSABLE) ×2 IMPLANT
TRAY LAPAROSCOPIC (CUSTOM PROCEDURE TRAY) ×2 IMPLANT
TROCAR BLADELESS OPT 5 100 (ENDOMECHANICALS) ×2 IMPLANT
TROCAR BLADELESS OPT 5 150 (ENDOMECHANICALS) ×2 IMPLANT
TROCAR XCEL NON-BLD 11X100MML (ENDOMECHANICALS) ×2 IMPLANT

## 2021-01-10 NOTE — H&P (Signed)
Emily Pace DOB: 07-Nov-1995 Single / Language: [place] / Race: White Female  Patient Care Team: Larkin Ina as PCP - General (Physician Assistant) Karie Soda, MD as Consulting Physician (General Surgery) Casimiro Needle, MD as Consulting Physician (Endocrinology) Larkin Ina as Physician Assistant (Family Medicine)  ` Patient sent for surgical consultation at the request of [name], NP  Chief Complaint: Abdominal pain. Question of gallbladder etiology ` ` The patient is a young woman that the struggle with intermittent abdominal pain for the past few months. Usually triggered by fast foods and heavy or greasy foods. She adjusted her diet. However she had an episode of sharp abdominal pain in the right upper quadrant. Was worried she may have a gallbladder attack. She went to urgent care center. However she was hesitant to get any labs or studies such as an ultrasound given the fact she was not ensured at the time. His recommended consider following up with her primary care office and consider workup. Sent to me for surgical consultation without any studies. Patient comes today by herself. She denies any severe pain but does have some chronic achiness in her right upper side triggered by eating. Denies really much in way of heartburn or reflux. She can sleep lying on her side. No pillows. No increased belching. She usually moves her bowels every day. She can walk a half hour without difficulty. No diabetes. No smoking. No sleep apnea. No cardiac or pulmonary issues. Does struggle with some social anxiety that has been stable for a while. She notes she recently got ensured and is trying to readdress her concerns.  No personal nor family history of GI/colon cancer, inflammatory bowel disease, irritable bowel syndrome, allergy such as Celiac Sprue, dietary/dairy problems, colitis, ulcers nor gastritis. No recent sick  contacts/gastroenteritis. No travel outside the country. No changes in diet. No dysphagia to solids or liquids. No significant heartburn or reflux. No melena, hematemesis, coffee ground emesis. No evidence of prior gastric/peptic ulceration.  (Review of systems as stated in this history (HPI) or in the review of systems. Otherwise all other 12 point ROS are negative) ` ` ###########################################`  This patient encounter took 30 minutes today to perform the following: obtain history, perform exam, review outside records, interpret tests & imaging, counsel the patient on their diagnosis; and, document this encounter, including findings & plan in the electronic health record (EHR).   Past Surgical History Ethlyn Gallery, CMA; 09/03/2020 10:59 AM) Oral Surgery   Diagnostic Studies History [name], CMA; 09/03/2020 10:59 AM) Colonoscopy  never Mammogram  never Pap Smear  1-5 years ago  Allergies [name], CMA; 09/03/2020 11:00 AM) Penicillins   Medication History ([name], CMA; 09/03/2020 11:01 AM) Sertraline HCl (100MG  Tablet, Oral) Active. Aviane (0.1-20MG -MCG Tablet, Oral) Active. Medications Reconciled  Pregnancy / Birth History , CMA; 09/03/2020 10:59 AM) Age at menarche  13 years. Contraceptive History  Oral contraceptives. Gravida  0 Irregular periods  Para  0  Other Problems ([name], CMA; 09/03/2020 10:59 AM) Anxiety Disorder  Back Pain     Review of Systems ([name] CMA; 09/03/2020 10:59 AM) General Not Present- Appetite Loss, Chills, Fatigue, Fever, Night Sweats, Weight Gain and Weight Loss. Skin Not Present- Change in Wart/Mole, Dryness, Hives, Jaundice, New Lesions, Non-Healing Wounds, Rash and Ulcer. HEENT Present- Wears glasses/contact lenses. Not Present- Earache, Hearing Loss, Hoarseness, Nose Bleed, Oral Ulcers, Ringing in the Ears, Seasonal Allergies,  Sinus Pain, Sore Throat, Visual Disturbances and  Yellow Eyes. Respiratory Not Present- Bloody sputum, Chronic Cough, Difficulty Breathing, Snoring and Wheezing. Breast Not Present- Breast Mass, Breast Pain, Nipple Discharge and Skin Changes. Cardiovascular Not Present- Chest Pain, Difficulty Breathing Lying Down, Leg Cramps, Palpitations, Rapid Heart Rate, Shortness of Breath and Swelling of Extremities. Gastrointestinal Present- Abdominal Pain, Bloating and Nausea. Not Present- Bloody Stool, Change in Bowel Habits, Chronic diarrhea, Constipation, Difficulty Swallowing, Excessive gas, Gets full quickly at meals, Hemorrhoids, Indigestion, Rectal Pain and Vomiting. Female Genitourinary Not Present- Frequency, Nocturia, Painful Urination, Pelvic Pain and Urgency. Musculoskeletal Present- Back Pain. Not Present- Joint Pain, Joint Stiffness, Muscle Pain, Muscle Weakness and Swelling of Extremities. Neurological Present- Headaches. Not Present- Decreased Memory, Fainting, Numbness, Seizures, Tingling, Tremor, Trouble walking and Weakness. Psychiatric Present- Anxiety. Not Present- Bipolar, Change in Sleep Pattern, Depression, Fearful and Frequent crying. Endocrine Not Present- Cold Intolerance, Excessive Hunger, Hair Changes, Heat Intolerance, Hot flashes and New Diabetes. Hematology Not Present- Blood Thinners, Easy Bruising, Excessive bleeding, Gland problems, HIV and Persistent Infections.  Vitals ([name] CMA; 09/03/2020 11:00 AM) 09/03/2020 10:59 AM Weight: 321 lb Height: 67in Body Surface Area: 2.47 m Body Mass Index: 50.28 kg/m  Pulse: 118 (Regular)  BP: 152/82(Sitting, Left Arm, Standard)   BP (!) 156/88   Pulse (!) 101   Temp 99.7 F (37.6 C) (Oral)   Resp 18   Ht 5\' 7"  (1.702 m)   Wt (!) 154.9 kg   SpO2 98%   BMI 53.47 kg/m  01/10/2021     Physical Exam 01/12/2021 MD; 09/03/2020 11:31 AM) General Mental Status-Alert. General Appearance-Not  in acute distress, Not Sickly. Orientation-Oriented X3. Hydration-Well hydrated. Voice-Normal.  Integumentary Global Assessment Upon inspection and palpation of skin surfaces of the - Axillae: non-tender, no inflammation or ulceration, no drainage. and Distribution of scalp and body hair is normal. General Characteristics Temperature - normal warmth is noted.  Head and Neck Head-normocephalic, atraumatic with no lesions or palpable masses. Face Global Assessment - atraumatic, no absence of expression. Neck Global Assessment - no abnormal movements, no bruit auscultated on the right, no bruit auscultated on the left, no decreased range of motion, non-tender. Trachea-midline. Thyroid Gland Characteristics - non-tender.  Eye Eyeball - Left-Extraocular movements intact, No Nystagmus - Left. Eyeball - Right-Extraocular movements intact, No Nystagmus - Right. Cornea - Left-No Hazy - Left. Cornea - Right-No Hazy - Right. Sclera/Conjunctiva - Left-No scleral icterus, No Discharge - Left. Sclera/Conjunctiva - Right-No scleral icterus, No Discharge - Right. Pupil - Left-Direct reaction to light normal. Pupil - Right-Direct reaction to light normal.  ENMT Ears Pinna - Left - no drainage observed, no generalized tenderness observed. Pinna - Right - no drainage observed, no generalized tenderness observed. Nose and Sinuses External Inspection of the Nose - no destructive lesion observed. Inspection of the nares - Left - quiet respiration. Inspection of the nares - Right - quiet respiration. Mouth and Throat Lips - Upper Lip - no fissures observed, no pallor noted. Lower Lip - no fissures observed, no pallor noted. Nasopharynx - no discharge present. Oral Cavity/Oropharynx - Tongue - no dryness observed. Oral Mucosa - no cyanosis observed. Hypopharynx - no evidence of airway distress observed.  Chest and Lung Exam Inspection Movements - Normal and  Symmetrical. Accessory muscles - No use of accessory muscles in breathing. Palpation Palpation of the chest reveals - Non-tender. Auscultation Breath sounds - Normal and Clear.  Cardiovascular Auscultation Rhythm - Regular. Murmurs & Other Heart Sounds - Auscultation of the heart reveals -  No Murmurs and No Systolic Clicks.  Abdomen Inspection Inspection of the abdomen reveals - No Visible peristalsis and No Abnormal pulsations. Umbilicus - No Bleeding, No Urine drainage. Palpation/Percussion Palpation and Percussion of the abdomen reveal - Soft, Non Tender, No Rebound tenderness, No Rigidity (guarding) and No Cutaneous hyperesthesia. Note: Abdomen soft. Tenderness in right upper quadrant along subcostal ridge but no true Murphy sign. Rest the abdomen obese and soft and nontender.Not severely distended. No diastasis recti. No umbilical or other anterior abdominal wall hernias   Female Genitourinary Sexual Maturity Tanner 5 - Adult hair pattern. Note: No vaginal bleeding nor discharge   Peripheral Vascular Upper Extremity Inspection - Left - No Cyanotic nailbeds - Left, Not Ischemic. Inspection - Right - No Cyanotic nailbeds - Right, Not Ischemic.  Neurologic Neurologic evaluation reveals -normal attention span and ability to concentrate, able to name objects and repeat phrases. Appropriate fund of knowledge , normal sensation and normal coordination. Mental Status Affect - not angry, not paranoid. Cranial Nerves-Normal Bilaterally. Gait-Normal.  Neuropsychiatric Mental status exam performed with findings of-able to articulate well with normal speech/language, rate, volume and coherence, thought content normal with ability to perform basic computations and apply abstract reasoning and no evidence of hallucinations, delusions, obsessions or homicidal/suicidal ideation.  Musculoskeletal Global Assessment Spine, Ribs and Pelvis - no instability, subluxation  or laxity. Right Upper Extremity - no instability, subluxation or laxity.  Lymphatic Head & Neck  General Head & Neck Lymphatics: Bilateral - Description - No Localized lymphadenopathy. Axillary  General Axillary Region: Bilateral - Description - No Localized lymphadenopathy. Femoral & Inguinal  Generalized Femoral & Inguinal Lymphatics: Left - Description - No Localized lymphadenopathy. Right - Description - No Localized lymphadenopathy.    Assessment & Plan Ardeth Sportsman MD; 09/03/2020 1:04 PM) POSTPRANDIAL RUQ PAIN (R10.11) Impression: Rather classic story of biliary colic with postprandial pain with fast food triggers. She's had less attacks and less intensity since she's transition to a lower fat diet, she still getting symptoms.  HIDA scan abnormal - I offered lap cholecystectomy:  Pt Education - Pamphlet Given - Laparoscopic Gallbladder Surgery: discussed with patient and provided information. The anatomy & physiology of hepatobiliary & pancreatic function was discussed. The pathophysiology of gallbladder dysfunction was discussed. Natural history risks without surgery was discussed. I feel the risks of no intervention will lead to serious problems that outweigh the operative risks; therefore, I recommended cholecystectomy to remove the pathology. I explained laparoscopic techniques with possible need for an open approach. Probable cholangiogram to evaluate the bilary tract was explained as well.  Risks such as bleeding, infection, abscess, leak, injury to other organs, need for further treatment, heart attack, death, and other risks were discussed. I noted a good likelihood this will help address the problem. Possibility that this will not correct all abdominal symptoms was explained. Goals of post-operative recovery were discussed as well. We will work to minimize complications. An educational handout further explaining the pathology and treatment options was  given as well. Questions were answered. The patient expresses understanding & wishes to proceed with surgery.  Pt Education - [facility] Laparosopic Post Op HCI (Nobuo Nunziata) Pt Education - [facility] Good Bowel Health (Kalub Morillo) CBC, PLATELETS & AUT DIFF (51884) METABOLIC PANEL, COMPREHENSIVE (80053) LIPASE (16606) RUQ [place] (RIGHT UPPER QUADRANT ULTRASOUND) (30160) (Pt needs RUQ u/s to eval. RUQ abdominal pain/ R/O gallstones)  ADDENDUM:  Ultrasound shows no gallstones with fatty liver.  Nuclear medicine HIDA scan study shows gallbladder ejection fraction 30% with reproduction  of symptoms.  Probable biliary dyskinesia I have offered cholecystectomy.  Ardeth SportsmanSteven C. Azizah Lisle, MD, FACS, MASCRS Gastrointestinal and Minimally Invasive Surgery  [facility] [address] [place], [place] [address] [phone] Fax [phone] Main/Paging  CONTACT INFORMATION: Weekday (9AM-5PM) concerns: Call [facility] main office at [phone] Weeknight (5PM-9AM) or Weekend/Holiday concerns: Check [email or web address] for General Surgery [facility] coverage (Please, do not use SecureChat as it is not reliable communication to operating surgeons for immediate patient care)

## 2021-01-10 NOTE — Progress Notes (Signed)
Pt waking up in PACU w/o pain Could not reach pt's mother RN at bedside I discussed operative findings, updated the patient's status, discussed probable steps to recovery, and gave postoperative recommendations to the patient and nurse.  Recommendations were made.  Questions were answered.  They expressed understanding & appreciation.  Ardeth Sportsman, MD, FACS, MASCRS  Gastrointestinal and Minimally Invasive Surgery  [facility] [address] [place], [place] [address] [phone] Fax [phone] Main/Paging  CONTACT INFORMATION: Weekday (9AM-5PM) concerns: Call [facility] main office at [phone] Weeknight (5PM-9AM) or Weekend/Holiday concerns: Check [email or web address] for General Surgery [facility] coverage (Please, do not use SecureChat as it is not reliable communication to operating surgeons for immediate patient care)

## 2021-01-10 NOTE — Anesthesia Procedure Notes (Signed)
Procedure Name: Intubation Date/Time: 01/10/2021 9:49 AM Performed by: West Pugh, CRNA Pre-anesthesia Checklist: Patient identified, Emergency Drugs available, Suction available, Patient being monitored and Timeout performed Patient Re-evaluated:Patient Re-evaluated prior to induction Oxygen Delivery Method: Circle system utilized Preoxygenation: Pre-oxygenation with 100% oxygen Induction Type: IV induction Ventilation: Mask ventilation without difficulty Laryngoscope Size: Mac and 3 Tube type: Oral Tube size: 7.0 mm Number of attempts: 1 Airway Equipment and Method: Stylet Placement Confirmation: ETT inserted through vocal cords under direct vision,  positive ETCO2 and breath sounds checked- equal and bilateral Secured at: 22 cm Tube secured with: Tape Dental Injury: Teeth and Oropharynx as per pre-operative assessment

## 2021-01-10 NOTE — Discharge Instructions (Signed)
General Anesthesia, Adult, Care After This sheet gives you information about how to care for yourself after your procedure. Your health care provider may also give you more specific instructions. If you have problems or questions, contact your health care provider. What can I expect after the procedure? After the procedure, the following side effects are common:  Pain or discomfort at the IV site.  Nausea.  Vomiting.  Sore throat.  Trouble concentrating.  Feeling cold or chills.  Feeling weak or tired.  Sleepiness and fatigue.  Soreness and body aches. These side effects can affect parts of the body that were not involved in surgery. Follow these instructions at home: For the time period you were told by your health care provider:  Rest.  Do not participate in activities where you could fall or become injured.  Do not drive or use machinery.  Do not drink alcohol.  Do not take sleeping pills or medicines that cause drowsiness.  Do not make important decisions or sign legal documents.  Do not take care of children on your own.   Eating and drinking  Follow any instructions from your health care provider about eating or drinking restrictions.  When you feel hungry, start by eating small amounts of foods that are soft and easy to digest (bland), such as toast. Gradually return to your regular diet.  Drink enough fluid to keep your urine pale yellow.  If you vomit, rehydrate by drinking water, juice, or clear broth. General instructions  If you have sleep apnea, surgery and certain medicines can increase your risk for breathing problems. Follow instructions from your health care provider about wearing your sleep device: ? Anytime you are sleeping, including during daytime naps. ? While taking prescription pain medicines, sleeping medicines, or medicines that make you drowsy.  Have a responsible adult stay with you for the time you are told. It is important to have  someone help care for you until you are awake and alert.  Return to your normal activities as told by your health care provider. Ask your health care provider what activities are safe for you.  Take over-the-counter and prescription medicines only as told by your health care provider.  If you smoke, do not smoke without supervision.  Keep all follow-up visits as told by your health care provider. This is important. Contact a health care provider if:  You have nausea or vomiting that does not get better with medicine.  You cannot eat or drink without vomiting.  You have pain that does not get better with medicine.  You are unable to pass urine.  You develop a skin rash.  You have a fever.  You have redness around your IV site that gets worse. Get help right away if:  You have difficulty breathing.  You have chest pain.  You have blood in your urine or stool, or you vomit blood. Summary  After the procedure, it is common to have a sore throat or nausea. It is also common to feel tired.  Have a responsible adult stay with you for the time you are told. It is important to have someone help care for you until you are awake and alert.  When you feel hungry, start by eating small amounts of foods that are soft and easy to digest (bland), such as toast. Gradually return to your regular diet.  Drink enough fluid to keep your urine pale yellow.  Return to your normal activities as told by your health care provider.   Ask your health care provider what activities are safe for you. This information is not intended to replace advice given to you by your health care provider. Make sure you discuss any questions you have with your health care provider. Document Revised: 06/20/2020 Document Reviewed: 01/18/2020 Elsevier Patient Education  2021 Elsevier Inc. LAPAROSCOPIC SURGERY: POST OP  INSTRUCTIONS  ######################################################################  EAT Gradually transition to a high fiber diet with a fiber supplement over the next few weeks after discharge.  Start with a pureed / full liquid diet (see below)  WALK Walk an hour a day.  Control your pain to do that.    CONTROL PAIN Control pain so that you can walk, sleep, tolerate sneezing/coughing, go up/down stairs.  HAVE A BOWEL MOVEMENT DAILY Keep your bowels regular to avoid problems.  OK to try a laxative to override constipation.  OK to use an antidairrheal to slow down diarrhea.  Call if not better after 2 tries  CALL IF YOU HAVE PROBLEMS/CONCERNS Call if you are still struggling despite following these instructions. Call if you have concerns not answered by these instructions  ######################################################################    1. DIET: Follow a light bland diet & liquids the first 24 hours after arrival home, such as soup, liquids, starches, etc.  Be sure to drink plenty of fluids.  Quickly advance to a usual solid diet within a few days.  Avoid fast food or heavy meals as your are more likely to get nauseated or have irregular bowels.  A low-fat, high-fiber diet for the rest of your life is ideal.  2. Take your usually prescribed home medications unless otherwise directed.  3. PAIN CONTROL: a. Pain is best controlled by a usual combination of three different methods TOGETHER: i. Ice/Heat ii. Over the counter pain medication iii. Prescription pain medication b. Most patients will experience some swelling and bruising around the incisions.  Ice packs or heating pads (30-60 minutes up to 6 times a day) will help. Use ice for the first few days to help decrease swelling and bruising, then switch to heat to help relax tight/sore spots and speed recovery.  Some people prefer to use ice alone, heat alone, alternating between ice & heat.  Experiment to what works for  you.  Swelling and bruising can take several weeks to resolve.   c. It is helpful to take an over-the-counter pain medication regularly for the first few weeks.  Choose one of the following that works best for you: i. Naproxen (Aleve, etc)  Two 220mg  tabs twice a day ii. Ibuprofen (Advil, etc) Three 200mg  tabs four times a day (every meal & bedtime) iii. Acetaminophen (Tylenol, etc) 500-650mg  four times a day (every meal & bedtime) d. A  prescription for pain medication (such as oxycodone, hydrocodone, tramadol, gabapentin, methocarbamol, etc) should be given to you upon discharge.  Take your pain medication as prescribed.  i. If you are having problems/concerns with the prescription medicine (does not control pain, nausea, vomiting, rash, itching, etc), please call [phone] to see if we need to switch you to a different pain medicine that will work better for you and/or control your side effect better. ii. If you need a refill on your pain medication, please give [place] 48 hour notice.  contact your pharmacy.  They will contact our office to request authorization. Prescriptions will not be filled after 5 pm or on week-ends  4. Avoid getting constipated.   a. Between the surgery and the pain medications, it is  common to experience some constipation.   b. Increasing fluid intake and taking a fiber supplement (such as Metamucil, Citrucel, FiberCon, MiraLax, etc) 1-2 times a day regularly will usually help prevent this problem from occurring.   c. A mild laxative (prune juice, Milk of Magnesia, MiraLax, etc) should be taken according to package directions if there are no bowel movements after 48 hours.   5. Watch out for diarrhea.   a. If you have many loose bowel movements, simplify your diet to bland foods & liquids for a few days.   b. Stop any stool softeners and decrease your fiber supplement.   c. Switching to mild anti-diarrheal medications (Kayopectate, Pepto Bismol) can help.   d. If this  worsens or does not improve, please call us.  6. Wash / shower every day.  You may shower over the dressings as they are waterproof.  Continue to shower over incision(s) after the dressing is off.  7. Remove your waterproof bandages 3 days after surgery.  You may leave the incision open to air.  You may replace a dressing/Band-Aid to cover the incision for comfort if you wish.   8. ACTIVITIES as tolerated:   i. You may resume regular (light) daily activities beginning the next day--such as daily self-care, walking, climbing stairs--gradually increasing activities as tolerated.  If you can walk 30 minutes without difficulty, it is safe to try more intense activity such as jogging, treadmill, bicycling, low-impact aerobics, swimming, etc. ii. Save the most intensive and strenuous activity for last such as sit-ups, heavy lifting, contact sports, etc  Refrain from any heavy lifting or straining until you are off narcotics for pain control.   iii. DO NOT PUSH THROUGH PAIN.  Let pain be your guide: If it hurts to do something, don't do it.  Pain is your body warning you to avoid that activity for another week until the pain goes down. iv. You may drive when you are no longer taking prescription pain medication, you can comfortably wear a seatbelt, and you can safely maneuver your car and apply brakes. v. You may have sexual intercourse when it is comfortable.  9. FOLLOW UP in our office a. Please call [facility] at [phone] to set up an appointment to see your surgeon in the office for a follow-up appointment approximately 2-3 weeks after your surgery. b. Make sure that you call for this appointment the day you arrive home to insure a convenient appointment time.  10. IF YOU HAVE DISABILITY OR FAMILY LEAVE FORMS, BRING THEM TO THE OFFICE FOR PROCESSING.  DO NOT GIVE THEM TO YOUR DOCTOR.   WHEN TO CALL us [phone]: 1. Poor pain control 2. Reactions / problems with new medications (rash/itching,  nausea, etc)  3. Fever over 101.5 F (38.5 C) 4. Inability to urinate 5. Nausea and/or vomiting 6. Worsening swelling or bruising 7. Continued bleeding from incision. 8. Increased pain, redness, or drainage from the incision   The clinic staff is available to answer your questions during regular business hours (8:30am-5pm).  Please don't hesitate to call and ask to speak to one of our nurses for clinical concerns.   If you have a medical emergency, go to the nearest emergency room or call 911.  A surgeon from [facility] is always on call at the [facility], [place] [address], [place], [place]  [address] ? MAIN: [phone] ? TOLL FREE: [phone] ?  FAX [phone]  [email or web address]

## 2021-01-10 NOTE — Op Note (Signed)
01/10/2021  PATIENT:  Emily Pace  25 y.o. female  Patient Care Team: Larkin Ina as PCP - General (Physician Assistant) Karie Soda, MD as Consulting Physician (General Surgery) Casimiro Needle, MD as Consulting Physician (Endocrinology) Morrell Riddle, PA-C as Physician Assistant (Family Medicine)  PRE-OPERATIVE DIAGNOSIS:    Chronic Cholecystitis  POST-OPERATIVE DIAGNOSIS:   Chronic Cholecystitis   PROCEDURE:  Laparoscopic cholecystectomy with intraoperative cholangiogram (CPT code 28786)  SURGEON:  Emily Sportsman, MD, FACS.  ASSISTANT: Emily Expose, MD, [facility] PGY-6   ANESTHESIA:    General with endotracheal intubation Local anesthetic as a field block  EBL:  (See Anesthesia Intraoperative Record) No intake/output data recorded.   Delay start of Pharmacological VTE agent (>24hrs) due to surgical blood loss or risk of bleeding:  no  DRAINS: None   SPECIMEN: Gallbladder    DISPOSITION OF SPECIMEN:  PATHOLOGY  COUNTS:  YES  PLAN OF CARE: Discharge to home after PACU  PATIENT DISPOSITION:  PACU - hemodynamically stable.  INDICATION: Young woman with postprandial nausea vomiting abdominal pain suspicious for biliary colic.  No gallstones but evidence of decreased gallbladder function and reproduction of symptoms with nuclear medicine HIDA scan.  I offered cholecystectomy  The anatomy & physiology of hepatobiliary & pancreatic function was discussed.  The pathophysiology of gallbladder dysfunction was discussed.  Natural history risks without surgery was discussed.   I feel the risks of no intervention will lead to serious problems that outweigh the operative risks; therefore, I recommended cholecystectomy to remove the pathology.  I explained laparoscopic techniques with possible need for an open approach.  Probable cholangiogram to evaluate the bilary tract was explained as well.    Risks such as bleeding, infection, abscess, leak, injury to  other organs, need for further treatment, heart attack, death, and other risks were discussed.  I noted a good likelihood this will help address the problem.  Possibility that this will not correct all abdominal symptoms was explained.  Goals of post-operative recovery were discussed as well.  We will work to minimize complications.  An educational handout further explaining the pathology and treatment options was given as well.  Questions were answered.  The patient expresses understanding & wishes to proceed with surgery.  OR FINDINGS: Dilated stretched out boggy gallbladder with some gray thickened wall changes consistent with chronic cholecystitis.  Cholangiogram showing narrowed biliary system with classic anatomy.  Liver: normal  DESCRIPTION:   Informed consent was confirmed.  The patient underwent general anaesthesia without difficulty.  The patient was positioned appropriately.  VTE prevention in place.  The patient's abdomen was clipped, prepped, & draped in a sterile fashion.  Surgical timeout confirmed our plan.  Peritoneal entry with a laparoscopic port was obtained using optical entry technique in the right upper abdomen as the patient was positioned in reverse Trendelenburg.  Entry was clean.  I induced carbon dioxide insufflation.  Camera inspection revealed no injury.  Extra ports were carefully placed under direct laparoscopic visualization.  I turned attention to the right upper quadrant.  Emily Pace a few adhesions of greater omentum with mesocolon to the liver and gallbladder.  The gallbladder fundus was elevated cephalad.  Upon entering the abdomen (organ space), I encountered no major concerns for infection.  We used hook cautery to free the peritoneal coverings between the gallbladder and the liver on the posteriolateral and anteriomedial walls.   We used careful blunt and hook dissection to help get a good critical view of the  cystic artery and cystic duct. I did further dissection  to free 50%of the gallbladder off the liver bed to get a good critical view of the infundibulum and cystic duct.  We dissected out the cystic artery; and, after getting a good 360 view, ligated the anterior & posterior branches of the cystic artery close on the infundibulum using the Harmonic ultrasonic dissection.  We skeletonized the cystic duct.  We placed a clip on the infundibulum. I did a partial cystic duct-otomy and ensured patency. I placed a 5 [place] cholangiocatheter through a puncture site at the right subcostal ridge of the abdominal wall and directed it into the cystic duct.  We ran a cholangiogram with dilute radio-opaque contrast and continuous fluoroscopy. Contrast flowed from a side branch consistent with cystic duct cannulization. Contrast flowed up the common hepatic duct into the right and left intrahepatic chains out to secondary radicals. Contrast flowed down the common bile duct easily across the normal ampulla into the duodenum.  This was consistent with a normal cholangiogram.  We removed the cholangiocatheter. I placed clips on the cystic duct x4.  We completed cystic duct transection. I freed the gallbladder from its remaining attachments to the liver. I ensured hemostasis on the gallbladder fossa of the liver and elsewhere. I inspected the rest of the abdomen & detected no injury nor bleeding elsewhere.  Liver was soft and pliable was normal, we felt no need for needle liver biopsy  Placed the gallbladder inside the sac and removed it out the subxiphoid fascia.  We closed the subxiphoid fascia transversely using #1 PDS interrupted stitch using a suture passer under direct laparoscopic visualization. We closed the skin using 4-0 monocryl stitch.  Sterile dressings were applied. The patient was extubated & arrived in the PACU in stable condition..  I had discussed postoperative care with the patient in the holding area.   I made an attempt to locate family to discuss patient's  status and recommendations.  No one is available at this time.  I will try again later    Emily Pace, M.D., F.A.C.S. Gastrointestinal and Minimally Invasive Surgery [facility] [address], [address] [place], [place] [address] [phone] Main / Paging  01/10/2021 11:36 AM

## 2021-01-10 NOTE — Interval H&P Note (Signed)
History and Physical Interval Note:  01/10/2021 9:34 AM  Emily Pace  has presented today for surgery, with the diagnosis of SYMPTOMATIC BILIARY COLIC, PROBABLE CHRONIC CHOLECYSTITIS.  The various methods of treatment have been discussed with the patient and family. After consideration of risks, benefits and other options for treatment, the patient has consented to  Procedure(s): LAPAROSCOPIC CHOLECYSTECTOMY WITH INTRAOPERATIVE CHOLANGIOGRAM (N/A) POSSIBLE NEEDLE CORE BIOPSY OF LIVER (N/A) as a surgical intervention.  The patient's history has been reviewed, patient examined, no change in status, stable for surgery.  I have reviewed the patient's chart and labs.  Questions were answered to the patient's satisfaction.    I have re-reviewed the the patient's records, history, medications, and allergies.  I have re-examined the patient.  I again discussed intraoperative plans and goals of post-operative recovery.  The patient agrees to proceed.  PAMA ROSKOS  1996-07-30 [id number]  Patient Care Team: Larkin Ina as PCP - General (Physician Assistant) Karie Soda, MD as Consulting Physician (General Surgery) Casimiro Needle, MD as Consulting Physician (Endocrinology) Morrell Riddle, PA-C as Physician Assistant (Family Medicine)  Patient Active Problem List   Diagnosis Date Noted   ADHD (attention deficit hyperactivity disorder), combined type 01/27/2016   Social anxiety disorder 01/27/2016   Obesity 04/03/2015   Acanthosis nigricans 04/03/2015   Morbid obesity (HCC) 09/07/2012    Past Medical History:  Diagnosis Date   ADHD (attention deficit hyperactivity disorder)    Followed by Dr. Kem Kays.  Started vyvanse at age 75yrs   Anemia    hx of iron infusion .  last one 12/26/20    Anxiety    Constipation    Depression    Headache    Obesity    Otitis media    Urinary tract infection    Vision abnormalities     Past Surgical History:  Procedure Laterality  Date   none     WISDOM TOOTH EXTRACTION      Social History   Socioeconomic History   Marital status: Single    Spouse name: Not on file   Number of children: Not on file   Years of education: Not on file   Highest education level: Not on file  Occupational History   Not on file  Tobacco Use   Smoking status: Never Smoker   Smokeless tobacco: Never Used  Vaping Use   Vaping Use: Never used  Substance and Sexual Activity   Alcohol use: Yes    Alcohol/week: 0.0 standard drinks    Comment: rarelly    Drug use: No   Sexual activity: Not on file  Other Topics Concern   Not on file  Social History Narrative   Is in 12th grade at [employer]   Lives with mother and grandmother   Social Determinants of Health   Financial Resource Strain: Not on file  Food Insecurity: Not on file  Transportation Needs: Not on file  Physical Activity: Not on file  Stress: Not on file  Social Connections: Not on file  Intimate Partner Violence: Not on file    Family History  Problem Relation Age of Onset   Obesity Mother    Hypertension Mother        Prescribed medication though does not take it   Obesity Father    Schizophrenia Father    Hypertension Maternal Grandmother     Medications Prior to Admission  Medication Sig Dispense Refill Last Dose   AVIANE 0.1-20 MG-MCG tablet Take  1 tablet by mouth daily.   01/09/2021 at Unknown time   ferrous gluconate (FERGON) 324 MG tablet Take 324 mg by mouth daily.   01/09/2021 at Unknown time   sertraline (ZOLOFT) 100 MG tablet Take 100 mg by mouth at bedtime.   01/09/2021 at Unknown time   benzonatate (TESSALON) 100 MG capsule Take 200 mg by mouth 3 (three) times daily as needed for cough.   More than a month at Unknown time   celecoxib (CELEBREX) 200 MG capsule Take 200 mg by mouth daily as needed for cramping.   More than a month at Unknown time    Current Facility-Administered Medications  Medication Dose Route Frequency Provider Last Rate Last  Admin   bupivacaine liposome (EXPAREL) 1.3 % injection 266 mg  20 mL Infiltration Once Karie Soda, MD       Chlorhexidine Gluconate Cloth 2 % PADS 6 each  6 each Topical Once Karie Soda, MD       And   Chlorhexidine Gluconate Cloth 2 % PADS 6 each  6 each Topical Once Karie Soda, MD       Melene Muller ON 01/11/2021] feeding supplement (ENSURE PRE-SURGERY) liquid 296 mL  296 mL Oral Once Karie Soda, MD       lactated ringers infusion   Intravenous Continuous Bethena Midget, MD 10 mL/hr at 01/10/21 0826 New Bag at 01/10/21 0826     Allergies  Allergen Reactions   Buspirone Other (See Comments)    Dizzy   Amoxicillin Hives   Tape Other (See Comments)    irritation   Biaxin [Clarithromycin] Rash    BP (!) 156/88   Pulse (!) 101   Temp 99.7 F (37.6 C) (Oral)   Resp 18   Ht 5\' 7"  (1.702 m)   Wt (!) 154.9 kg   SpO2 98%   BMI 53.47 kg/m   Labs: Results for orders placed or performed during the hospital encounter of 01/10/21 (from the past 48 hour(s))  Pregnancy, urine per protocol     Status: None   Collection Time: 01/10/21  7:49 AM  Result Value Ref Range   Preg Test, Ur NEGATIVE NEGATIVE    Comment:        THE SENSITIVITY OF THIS METHODOLOGY IS >20 mIU/mL. Performed at [facility], [address] [place], [place]     Imaging / Studies: No results found.   [id number], M.D., F.A.C.S. Gastrointestinal and Minimally Invasive Surgery [facility] [address] [place], [place] [phone] Main / Paging  01/10/2021 9:34 AM    01/12/2021

## 2021-01-10 NOTE — Brief Op Note (Signed)
Patient transferred from P-1 to P-2. Currently in bathroom.  1235: Patient now in recliner after voiding in bathroom.

## 2021-01-10 NOTE — Transfer of Care (Signed)
Immediate Anesthesia Transfer of Care Note  Patient: Emily Pace  Procedure(s) Performed: LAPAROSCOPIC CHOLECYSTECTOMY WITH INTRAOPERATIVE CHOLANGIOGRAM (N/A )  Patient Location: PACU  Anesthesia Type:General  Level of Consciousness: drowsy and patient cooperative  Airway & Oxygen Therapy: Patient Spontanous Breathing and Patient connected to face mask oxygen  Post-op Assessment: Report given to RN and Post -op Vital signs reviewed and stable  Post vital signs: Reviewed and stable  Last Vitals:  Vitals Value Taken Time  BP 144/88 01/10/21 1145  Temp 36.8 C 01/10/21 1145  Pulse 116 01/10/21 1148  Resp 15 01/10/21 1148  SpO2 100 % 01/10/21 1148  Vitals shown include unvalidated device data.  Last Pain:  Vitals:   01/10/21 1145  TempSrc:   PainSc: 0-No pain      Patients Stated Pain Goal: 5 (01/10/21 0809)  Complications: No complications documented.

## 2021-01-10 NOTE — Anesthesia Postprocedure Evaluation (Signed)
Anesthesia Post Note  Patient: Emily Pace  Procedure(s) Performed: LAPAROSCOPIC CHOLECYSTECTOMY WITH INTRAOPERATIVE CHOLANGIOGRAM (N/A )     Patient location during evaluation: PACU Anesthesia Type: General Level of consciousness: awake and alert Pain management: pain level controlled Vital Signs Assessment: post-procedure vital signs reviewed and stable Respiratory status: spontaneous breathing, nonlabored ventilation and respiratory function stable Cardiovascular status: blood pressure returned to baseline and stable Postop Assessment: no apparent nausea or vomiting Anesthetic complications: no   No complications documented.  Last Vitals:  Vitals:   01/10/21 1215 01/10/21 1230  BP: 125/76 (!) 146/98  Pulse: 94   Resp: 17 18  Temp: 36.6 C 37.3 C  SpO2: 96%     Last Pain:  Vitals:   01/10/21 1230  TempSrc: Oral  PainSc:                  Jizel Cheeks,W. EDMOND

## 2021-01-11 ENCOUNTER — Encounter (HOSPITAL_COMMUNITY): Payer: Self-pay | Admitting: Surgery

## 2021-01-13 LAB — SURGICAL PATHOLOGY

## 2022-01-02 ENCOUNTER — Other Ambulatory Visit: Payer: Self-pay | Admitting: Physician Assistant

## 2022-01-02 DIAGNOSIS — N939 Abnormal uterine and vaginal bleeding, unspecified: Secondary | ICD-10-CM

## 2022-01-05 ENCOUNTER — Other Ambulatory Visit: Payer: Self-pay

## 2022-01-05 ENCOUNTER — Ambulatory Visit
Admission: RE | Admit: 2022-01-05 | Discharge: 2022-01-05 | Disposition: A | Discharge status: Home / Self Care | Payer: Managed Care, Other (non HMO) | Source: Ambulatory Visit | Attending: Physician Assistant | Admitting: Physician Assistant

## 2022-01-05 DIAGNOSIS — N939 Abnormal uterine and vaginal bleeding, unspecified: Secondary | ICD-10-CM

## 2022-02-28 IMAGING — RF DG CHOLANGIOGRAM OPERATIVE
1 series · 4 of 4 positions shown · non-contrast
Comparison: None.

CLINICAL DATA: Cholecystectomy.

EXAM:
INTRAOPERATIVE CHOLANGIOGRAM
TECHNIQUE: Cholangiographic images from the C-arm fluoroscopic device were
submitted for interpretation post-operatively. Please see the
procedural report for the amount of contrast and the fluoroscopy
time utilized.

[Series 1: run · 4 of 66 frames shown]
[frame 10/66]
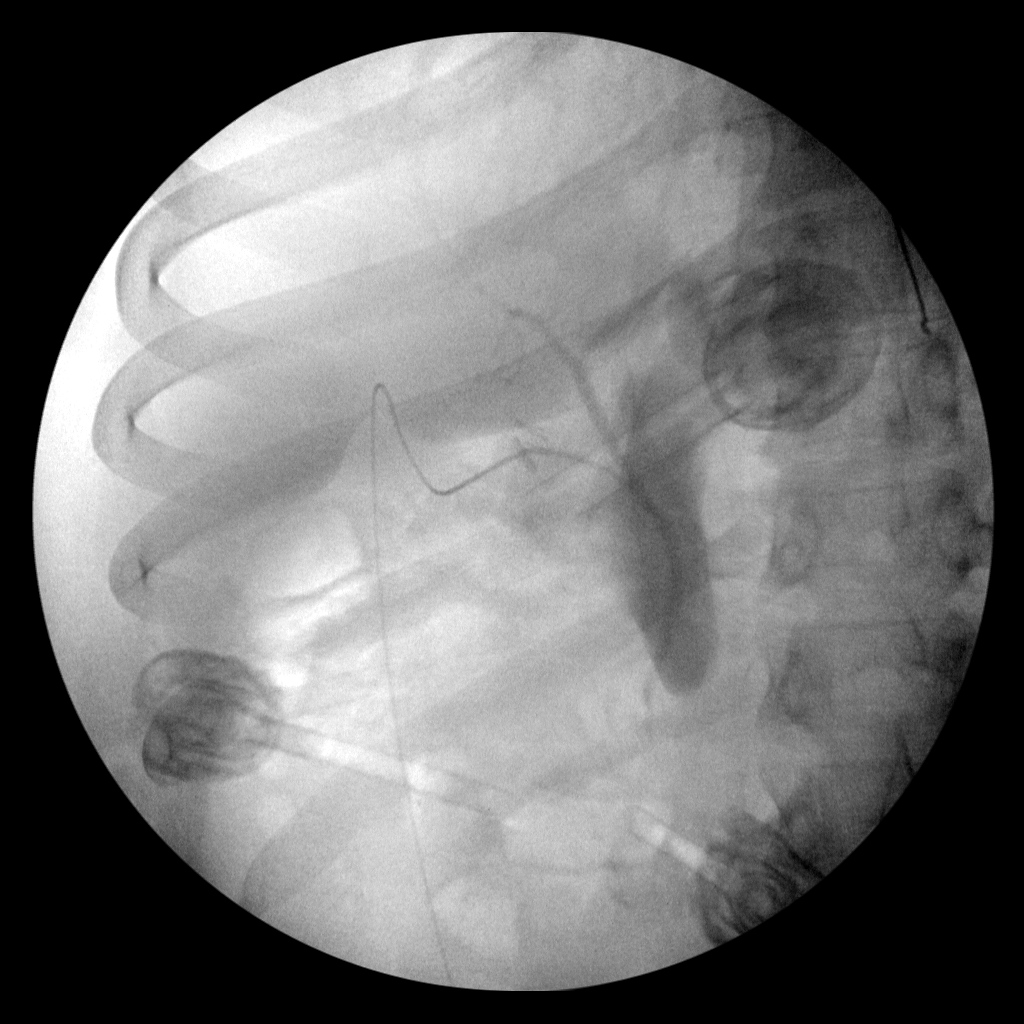
[frame 34/66]
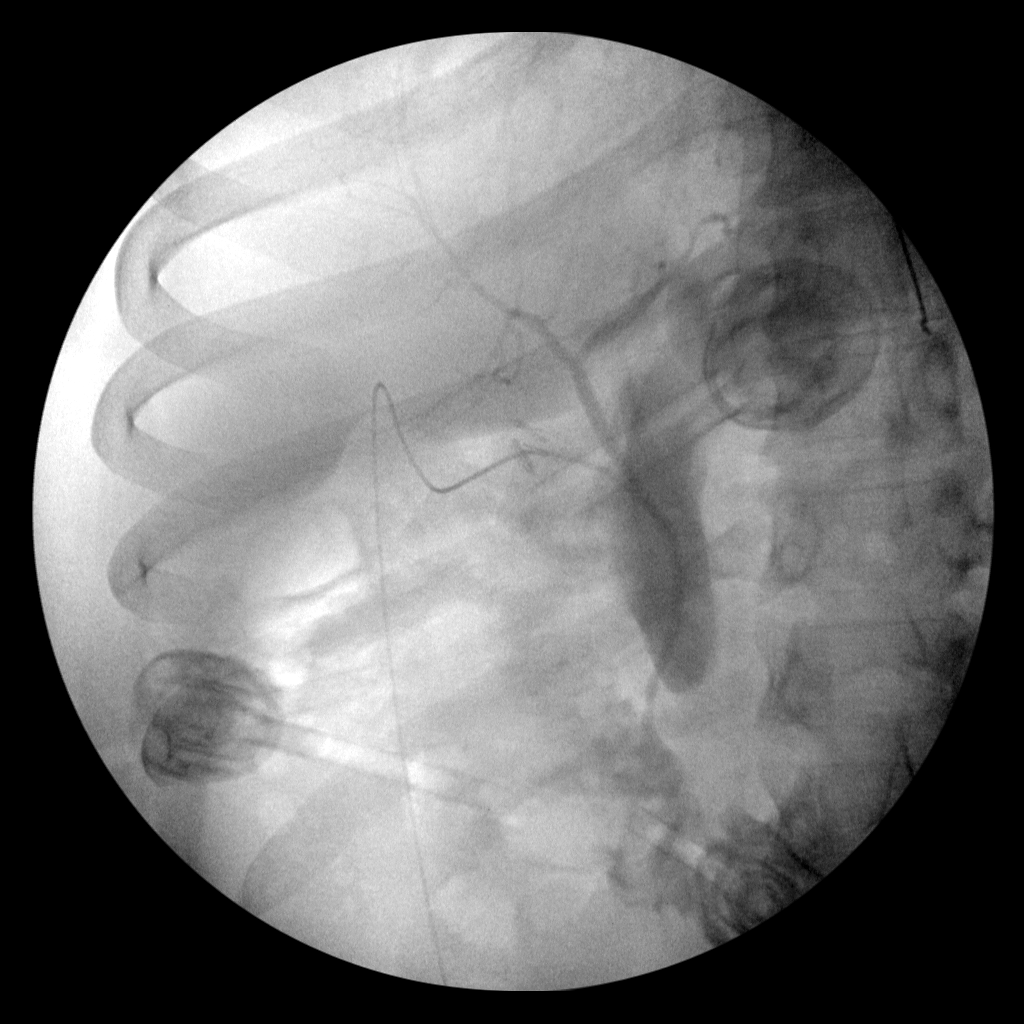
[frame 57/66]
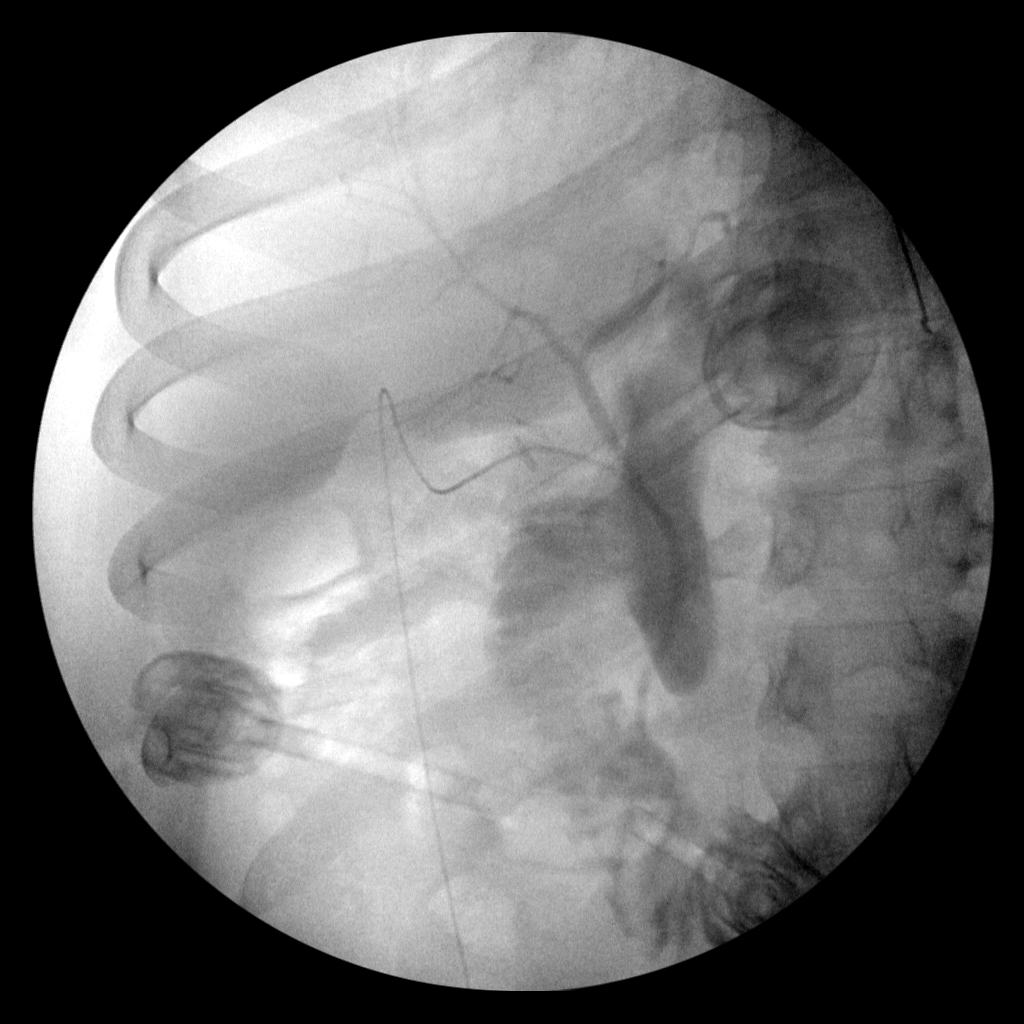
[frame 62/66]
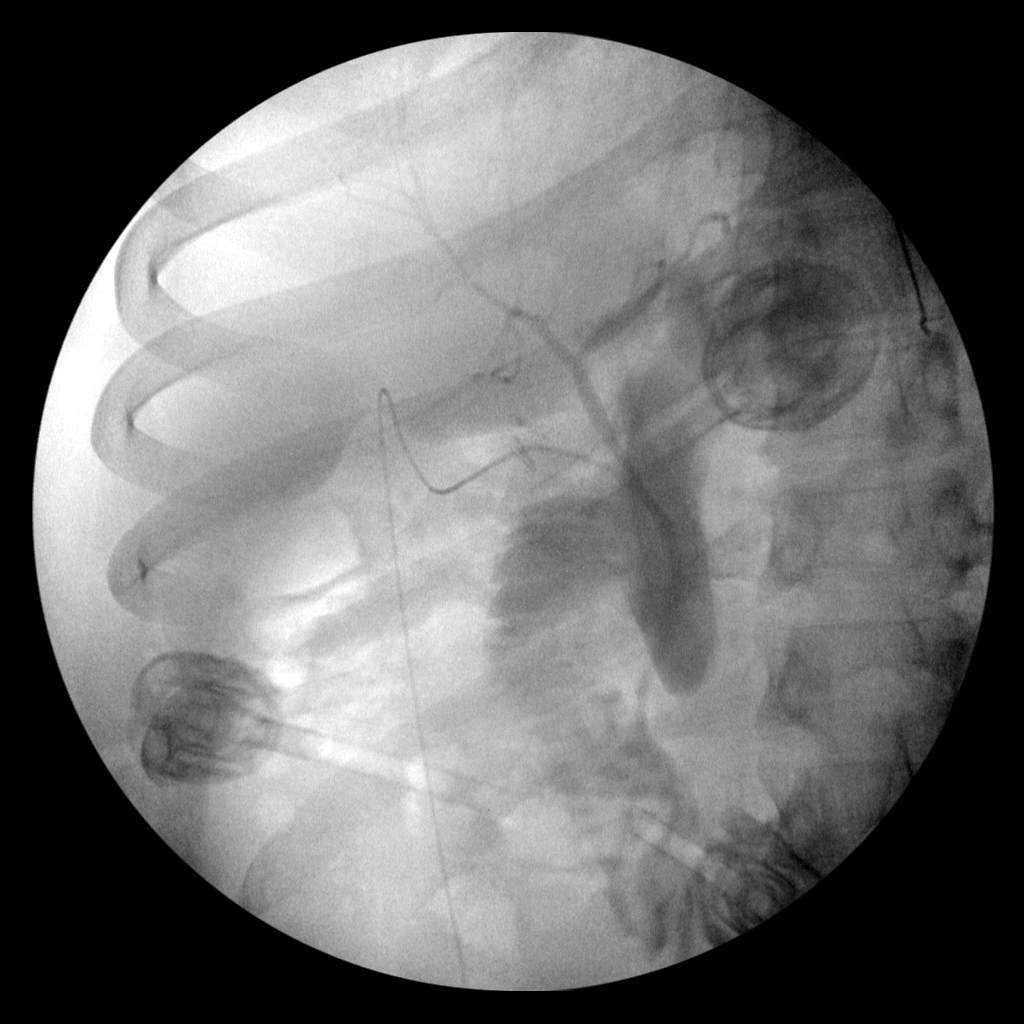

[4 of 4 positions shown; findings below may reference images not displayed]

FINDINGS: Intraoperative cholangiogram demonstrates normal opacified bile
ducts without evidence biliary dilatation, filling defect or
obstruction. Contrast enters the duodenum normally.
IMPRESSION: Normal intraoperative cholangiogram.

## 2022-03-06 ENCOUNTER — Other Ambulatory Visit: Payer: Self-pay

## 2022-06-11 ENCOUNTER — Other Ambulatory Visit (HOSPITAL_COMMUNITY)
Admission: RE | Admit: 2022-06-11 | Discharge: 2022-06-11 | Disposition: A | Discharge status: Home / Self Care | Payer: Commercial Managed Care - HMO | Source: Ambulatory Visit | Attending: Nurse Practitioner | Admitting: Nurse Practitioner

## 2022-06-11 DIAGNOSIS — Z124 Encounter for screening for malignant neoplasm of cervix: Secondary | ICD-10-CM | POA: Insufficient documentation

## 2022-06-24 LAB — CYTOLOGY - PAP: Diagnosis: NEGATIVE

## 2023-01-07 ENCOUNTER — Ambulatory Visit: Payer: Commercial Managed Care - HMO | Admitting: Podiatry

## 2023-01-07 ENCOUNTER — Ambulatory Visit (INDEPENDENT_AMBULATORY_CARE_PROVIDER_SITE_OTHER): Payer: Commercial Managed Care - HMO

## 2023-01-07 DIAGNOSIS — M722 Plantar fascial fibromatosis: Secondary | ICD-10-CM

## 2023-01-07 MED ORDER — DICLOFENAC SODIUM 75 MG PO TBEC: 75.0000 mg | DELAYED_RELEASE_TABLET | Freq: Two times a day (BID) | ORAL | 2 refills | Status: AC

## 2023-01-07 MED ORDER — TRIAMCINOLONE ACETONIDE 10 MG/ML IJ SUSP
20.0000 mg | Freq: Once | INTRAMUSCULAR | Status: AC
  Administered 2023-01-07: 20 mg

## 2023-01-07 NOTE — Progress Notes (Signed)
Subjective:   Patient ID: Emily Pace, female   DOB: 27 y.o.   MRN: [id number]   HPI Patient presents with severe pain in the heel region right over left of at least 1 year duration with obesity is complicating factor.  States that she needs to get more active now and is not able to due to the pain and right over left has been the worst.  Patient does not smoke tries to be active   Review of Systems  All other systems reviewed and are negative.       Objective:  Physical Exam Vitals and nursing note reviewed.  Constitutional:      Appearance: She is well-developed.  Pulmonary:     Effort: Pulmonary effort is normal.  Musculoskeletal:        General: Normal range of motion.  Skin:    General: Skin is warm.  Neurological:     Mental Status: She is alert.     Neurovascular status intact muscle strength adequate range of motion adequate exquisite discomfort noted medial fascial band right over left with inflammation fluid around the medial side.  Good digital perfusion well oriented x 3     Assessment:  Acute Planter fasciitis right over left and young individual who has extreme obesity but is motivated to try to lose weight but cannot exercise due to pain     Plan:  H&P conditions reviewed sterile prep injected the plantar fascia bilateral 3 mg Kenalog 5 mg Xylocaine applied fascial brace bilateral gave instructions on support and anti-inflammatories diclofenac.  Reappoint 2 weeks may require orthotics or other treatment  X-rays indicate small spur formation right over left no indication stress fracture arthritis

## 2023-01-07 NOTE — Patient Instructions (Signed)

## 2023-01-08 ENCOUNTER — Telehealth: Payer: Self-pay | Admitting: *Deleted

## 2023-01-08 NOTE — Telephone Encounter (Signed)
Patient is requesting a work note for the remainder to the day and tomorrow(01/08/23-01/09/23) ,feet are still sore, arches are cramping since having injection in both feet. Please advise/ send note thru  [facility].

## 2023-01-11 NOTE — Telephone Encounter (Signed)
That is fine 

## 2023-01-21 ENCOUNTER — Ambulatory Visit: Payer: Commercial Managed Care - HMO | Admitting: Podiatry

## 2023-01-28 ENCOUNTER — Encounter: Payer: Self-pay | Admitting: Podiatry

## 2023-01-28 ENCOUNTER — Ambulatory Visit (INDEPENDENT_AMBULATORY_CARE_PROVIDER_SITE_OTHER): Payer: Commercial Managed Care - HMO | Admitting: Podiatry

## 2023-01-28 DIAGNOSIS — M722 Plantar fascial fibromatosis: Secondary | ICD-10-CM | POA: Diagnosis not present

## 2023-01-28 NOTE — Progress Notes (Signed)
Subjective:   Patient ID: Emily Pace, female   DOB: 27 y.o.   MRN: [id number]   HPI Patient presents with painful heels of both feet that have improved some with previous injections and strapping but still quite bothersome with obesity is complicating factor with patient who wants to get more active   ROS      Objective:  Physical Exam  Neurovascular status intact discomfort still present plantar heel improved from previous visit with pain still noted upon deep palpation     Assessment:  Fasciitis of the heel region bilateral still present moderate improvement with conservative treatment     Plan:  H&P reviewed condition and went ahead today recommended night splint which was dispensed ice therapy to be done daily and went ahead and I casted for functional orthotics to try to lift up her arches take pressure off her feet

## 2023-03-19 ENCOUNTER — Telehealth: Payer: Self-pay | Admitting: Podiatry

## 2023-03-19 NOTE — Telephone Encounter (Signed)
Lmom for pt to call and schedule picking up orthotics    Balance is 490.00

## 2023-04-08 ENCOUNTER — Other Ambulatory Visit: Payer: Commercial Managed Care - HMO

## 2023-04-14 NOTE — Telephone Encounter (Signed)
Lmom for pt to call and schedule picking up orthotics    Balance is 490.00 

## 2023-04-29 ENCOUNTER — Ambulatory Visit (INDEPENDENT_AMBULATORY_CARE_PROVIDER_SITE_OTHER): Payer: Commercial Managed Care - HMO | Admitting: Podiatry

## 2023-04-29 DIAGNOSIS — M722 Plantar fascial fibromatosis: Secondary | ICD-10-CM

## 2023-04-29 NOTE — Progress Notes (Signed)
Patient presents today to pick up custom orthoticfs  Patient was dispensed 1 pair of custom orthotics  Fit was satisfactory. Instructions for break-in and wear was reviewed and a copy was given to the patient.

## 2023-05-05 ENCOUNTER — Ambulatory Visit: Payer: Commercial Managed Care - HMO | Admitting: Podiatry

## 2023-05-05 ENCOUNTER — Encounter: Payer: Self-pay | Admitting: Podiatry

## 2023-05-05 DIAGNOSIS — M722 Plantar fascial fibromatosis: Secondary | ICD-10-CM

## 2023-05-05 NOTE — Progress Notes (Signed)
Subjective:   Patient ID: Emily Pace, female   DOB: 27 y.o.   MRN: [id number]   HPI Patient states she is trying to get more active but her heels have flared up again it has been 4 months since we applied medication orthotics she is only had for 1 week   ROS      Objective:  Physical Exam  Neurovasc there are status intact with patient found to have inflammation pain of the plantar heel region bilateral fluid buildup     Assessment:  Acute Planter fasciitis bilateral reoccurrence     Plan:  Sterile prep reinjected the plantar fascia 3 mg Kenalog 5 mg Xylocaine reappoint to recheck begin increased activity

## 2023-05-14 ENCOUNTER — Ambulatory Visit: Payer: Commercial Managed Care - HMO | Admitting: Podiatry

## 2023-07-01 ENCOUNTER — Ambulatory Visit (INDEPENDENT_AMBULATORY_CARE_PROVIDER_SITE_OTHER): Payer: Commercial Managed Care - HMO | Admitting: Family Medicine

## 2023-07-01 ENCOUNTER — Encounter: Payer: Self-pay | Admitting: Family Medicine

## 2023-07-01 ENCOUNTER — Ambulatory Visit (INDEPENDENT_AMBULATORY_CARE_PROVIDER_SITE_OTHER): Payer: Commercial Managed Care - HMO

## 2023-07-01 ENCOUNTER — Other Ambulatory Visit: Payer: Self-pay

## 2023-07-01 VITALS — BP 122/84 | HR 67 | Ht 67.0 in | Wt 367.0 lb

## 2023-07-01 DIAGNOSIS — M722 Plantar fascial fibromatosis: Secondary | ICD-10-CM | POA: Diagnosis not present

## 2023-07-01 DIAGNOSIS — M25561 Pain in right knee: Secondary | ICD-10-CM

## 2023-07-01 DIAGNOSIS — G8929 Other chronic pain: Secondary | ICD-10-CM | POA: Diagnosis not present

## 2023-07-01 NOTE — Progress Notes (Signed)
   I, [name], CMA acting as a scribe for Emily Graham, MD.  Emily Pace is a 27 y.o. female who presents to [facility] today for R knee pain x 1-2 years, worsening over the past 2-3 months. MOI: no known injury. Pt locates pain to medial aspect of the knee. Noticed mechanical sx with stairs 2-3 months ago. Minor childhood injuries. Denies visible swelling.   R Knee swelling: no Mechanical symptoms: yes Aggravates: prolonged standing, stairs Treatments tried: Tylenol, IBU  Edition towards the end of the visit she mentioned plantar fasciitis.  She has a history of bilateral plantar fasciitis that has been bothersome for some time.  She has been doing some home stretching activity at home but not a lot of strengthening.  She think she has got pretty good footwear.  Pertinent review of systems: No fevers or chills  Relevant historical information: Obesity and ADHD.   Exam:  BP 122/84   Pulse 67   Ht 5\' 7"  (1.702 m)   Wt (!) 367 lb (166.5 kg)   SpO2 99%   BMI 57.48 kg/m  General: Well Developed, well nourished, and in no acute distress.   MSK: Right knee relatively normal-appearing Normal motion.  Crepitation present with knee extension. Nontender. Stable ligamentous exam. Intact strength.    Lab and Radiology Results   X-ray images right knee obtained today personally and independently interpreted High riding patella with mild degeneration.  Otherwise x-ray examination of the knee is unremarkable.  No acute fractures. Await formal radiology review  Assessment and Plan: 27 y.o. 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  We talked about options.  Home exercise program taught today for VMO strengthening.  Consider physical therapy or aquatic physical therapy in the future.  Also recommend Voltaren gel.  We did talk a little bit about plantar fasciitis home exercise  program.  Try that in addition to night splints and ice massage.   PDMP not reviewed this encounter. Orders Placed This Encounter  Procedures   DG Knee AP/LAT W/Sunrise Right    Standing Status:   Future    Number of Occurrences:   1    Standing Expiration Date:   07/31/2023    Order Specific Question:   Reason for Exam (SYMPTOM  OR DIAGNOSIS REQUIRED)    Answer:   right knee pain    Order Specific Question:   Preferred imaging location?    Answer:   [facility]    Order Specific Question:   Is patient pregnant?    Answer:   No   No orders of the defined types were placed in this encounter.    Discussed warning signs or symptoms. Please see discharge instructions. Patient expresses understanding.   The above documentation has been reviewed and is accurate and complete Emily Pace, M.D.

## 2023-07-01 NOTE — Patient Instructions (Addendum)
Thank you for coming in today.  Please get an Xray today before you leave  Please complete the exercises that the athletic trainer went over with you: View at [email or web address] using code: [email or web address]   Work on the exercises at home.  If not getting better, I can order physical therapy  Check back as needed

## 2023-07-02 DIAGNOSIS — M722 Plantar fascial fibromatosis: Secondary | ICD-10-CM | POA: Insufficient documentation

## 2023-07-02 DIAGNOSIS — G8929 Other chronic pain: Secondary | ICD-10-CM | POA: Insufficient documentation

## 2023-07-05 NOTE — Progress Notes (Signed)
Right knee x-ray looks okay to radiology.

## 2024-11-16 ENCOUNTER — Encounter (HOSPITAL_COMMUNITY): Payer: Self-pay

## 2024-11-16 ENCOUNTER — Emergency Department (HOSPITAL_COMMUNITY)

## 2024-11-16 ENCOUNTER — Other Ambulatory Visit: Payer: Self-pay

## 2024-11-16 ENCOUNTER — Emergency Department (HOSPITAL_COMMUNITY)
Admission: EM | Admit: 2024-11-16 | Discharge: 2024-11-16 | Disposition: A | Discharge status: Home / Self Care | Attending: Emergency Medicine | Admitting: Emergency Medicine

## 2024-11-16 DIAGNOSIS — X501XXA Overexertion from prolonged static or awkward postures, initial encounter: Secondary | ICD-10-CM | POA: Insufficient documentation

## 2024-11-16 DIAGNOSIS — M25562 Pain in left knee: Secondary | ICD-10-CM | POA: Diagnosis present

## 2024-11-16 MED ORDER — ACETAMINOPHEN 500 MG PO TABS
1000.0000 mg | ORAL_TABLET | Freq: Once | ORAL | Status: AC
  Administered 2024-11-16: 1000 mg via ORAL
  Filled 2024-11-16: qty 2

## 2024-11-16 NOTE — Progress Notes (Signed)
 Orthopedic Tech Progress Note Patient Details:  Emily Pace Jun 14, 1996 [id number]  Ortho Devices Type of Ortho Device: Knee Immobilizer Ortho Device/Splint Location: left knee immobilizer applied Ortho Device/Splint Interventions: Ordered, Application, Adjustment   Post Interventions Patient Tolerated: Well Instructions Provided: Adjustment of device, Care of device  Waylan Thom Loving 11/16/2024, 10:01 AM

## 2024-11-16 NOTE — Discharge Instructions (Signed)
 Please follow-up with orthopedics and primary care.  Seek emergency care if experiencing any new or worsening symptoms.  Alternating between 650 mg Tylenol  and 400 mg Advil: The best way to alternate taking Acetaminophen  (example Tylenol ) and Ibuprofen (example Advil/Motrin) is to take them 3 hours apart. For example, if you take ibuprofen at 6 am you can then take Tylenol  at 9 am. You can continue this regimen throughout the day, making sure you do not exceed the recommended maximum dose for each drug.

## 2024-11-16 NOTE — ED Provider Notes (Signed)
 " [place] [facility] AT [facility] Provider Note   CSN: [id number] Arrival date & time: 11/16/24  9189     Patient presents with: Knee Pain   Emily Pace is a 29 y.o. female with PMHx anemia, headaches who presents to ED concerned for left knee pain since yesterday. Patient stood up from a squat when she heard a pop. She the was experiencing a lot of pain in the knee so she moved it around and felt another pop and the pain started to feel better. Patient then woke up this morning with increased pain.   Patient denies LE paresthesias. Denies other infectious symptoms such as fever, chest pain, SOB.     Knee Pain      Prior to Admission medications  Medication Sig Start Date End Date Taking? Authorizing Provider  amphetamine-dextroamphetamine (ADDERALL) 10 MG tablet Take 10 mg by mouth 2 (two) times daily. 04/01/23   [provider]  Atogepant (QULIPTA) 60 MG TABS Take by mouth.    [provider]  Biotin 5000 MCG CAPS     [provider]  clindamycin-benzoyl peroxide (BENZACLIN) gel Apply 1 Application topically daily. 06/03/23   [provider]  Cyanocobalamin (B-12) 500 MCG TABS  05/28/21   [provider]  cyclobenzaprine (FLEXERIL) 10 MG tablet Take by mouth. 10/06/21   [provider]  desvenlafaxine (PRISTIQ) 25 MG 24 hr tablet Take 25 mg by mouth daily. 06/25/23   [provider]  diclofenac  (VOLTAREN ) 75 MG EC tablet Take 1 tablet (75 mg total) by mouth 2 (two) times daily. 01/07/23   Magdalen Pasco RAMAN, DPM  econazole nitrate 1 % cream Apply topically daily.    [provider]  etonogestrel (NEXPLANON) 68 MG IMPL implant 1 each by Subdermal route once.    [provider]  ferrous gluconate (FERGON) 324 MG tablet Take 324 mg by mouth daily. 12/21/20   [provider]  lidocaine  (LIDODERM ) 5 % Place 1 patch onto the skin daily. Remove & Discard patch within 12 hours or as  directed by MD    [provider]  lipase/protease/amylase (CREON) 12000-38000 units CPEP capsule Take by mouth.    [provider]  propranolol (INDERAL) 20 MG tablet Take 20 mg by mouth 3 (three) times daily.    [provider]  sertraline (ZOLOFT) 100 MG tablet Take 100 mg by mouth at bedtime. 12/23/20   [provider]  spironolactone (ALDACTONE) 50 MG tablet Take 50 mg by mouth daily. 06/07/23   [provider]  traMADol  (ULTRAM ) 50 MG tablet Take 1-2 tablets (50-100 mg total) by mouth every 6 (six) hours as needed for moderate pain or severe pain. 01/10/21   Sheldon Standing, MD    Allergies: Buspirone, Amoxicillin, Prednisone, Tape, Biaxin [clarithromycin], and Tretinoin    Review of Systems  Musculoskeletal:        Knee pain    Updated Vital Signs BP (!) 147/102   Pulse 89   Temp 98.2 F (36.8 C) (Oral)   Resp 19   Ht 5' 7 (1.702 m)   Wt (!) 148.8 kg   SpO2 100%   BMI 51.37 kg/m   Physical Exam Vitals and nursing note reviewed.  Constitutional:      General: She is not in acute distress.    Appearance: She is not ill-appearing or toxic-appearing.  HENT:     Head: Normocephalic and atraumatic.  Eyes:     General: No scleral  icterus.       Right eye: No discharge.        Left eye: No discharge.     Conjunctiva/sclera: Conjunctivae normal.  Cardiovascular:     Rate and Rhythm: Normal rate.  Pulmonary:     Effort: Pulmonary effort is normal.  Abdominal:     General: Abdomen is flat.  Musculoskeletal:     Comments: Left knee: ROM intact. +2 pedal pulse. Sensation to light touch intact. No obvious erythema or swelling appreciated. There is tenderness to palpation along anterolateral joint line and along towards posterolateral joint line. No crepitus or step-offs appreciated.   Skin:    General: Skin is warm and dry.  Neurological:     General: No focal deficit present.     Mental Status: She is alert and oriented to person,  place, and time. Mental status is at baseline.  Psychiatric:        Mood and Affect: Mood normal.        Behavior: Behavior normal.     (all labs ordered are listed, but only abnormal results are displayed) Labs Reviewed - No data to display  EKG: None  Radiology: DG Knee Complete 4 Views Left Result Date: 11/16/2024 CLINICAL DATA:  Felt knee pop yesterday when squatting down. Continued LEFT knee pain. EXAM: LEFT KNEE - COMPLETE 4+ VIEW COMPARISON:  None available FINDINGS: No fracture, dislocation, or soft tissue abnormality. IMPRESSION: Normal radiographic appearance of the LEFT knee. Electronically Signed   By: Aliene Lloyd M.D.   On: 11/16/2024 08:45     Procedures   Medications Ordered in the ED  acetaminophen  (TYLENOL ) tablet 1,000 mg (has no administration in time range)                                    Medical Decision Making Amount and/or Complexity of Data Reviewed Radiology: ordered.   This patient presents to the ED for concern of knee pain, this involves an extensive number of treatment options, and is a complaint that carries with it a high risk of complications and morbidity.  The differential diagnosis includes hemarthrosis, gout, septic joint, fracture, tendonitis, muscle strain, bursitis, compartment syndrome   Co morbidities that complicate the patient evaluation  anemia, headaches    Additional history obtained:  Dr. Suzie PCP   Problem List / ED Course / Critical interventions / Medication management  Patient presents to ED concern for left knee pain.  Symptoms started when she stood up from a squat and felt a pop in her knee.  Physical exam with tenderness along anterolateral joint line. Rest of physical exam reassuring.  Patient afebrile with stable vitals. I ordered imaging studies including left knee x-ray. I independently visualized and interpreted imaging. I agree with the radiologist interpretation of no acute process.  Shared results  with patient.  Answered all questions.  Will provide patient with a knee brace and have them follow-up with outpatient orthopedics.  Educated patient on alternating Advil and Tylenol  for pain management.  Patient agreeable with plan. I have reviewed the patients home medicines and have made adjustments as needed The patient has been appropriately medically screened and/or stabilized in the ED. I have low suspicion for any other emergent medical condition which would require further screening, evaluation or treatment in the ED or require inpatient management. At time of discharge the patient is hemodynamically stable and in no acute distress. I  have discussed work-up results and diagnosis with patient and answered all questions. Patient is agreeable with discharge plan. We discussed strict return precautions for returning to the emergency department and they verbalized understanding.     Social Determinants of Health:  none      Final diagnoses:  Acute pain of left knee    ED Discharge Orders     None          Hoy Nidia FALCON, [place] 11/16/24 9053    Suzette Pac, MD 11/18/24 1849  "

## 2024-11-16 NOTE — ED Triage Notes (Signed)
 Patient was at work yesterday, squatted down, something popped in her left knee. Painful to walk today. Said it popped back a 2nd time and felt like it went back into place.
